# Patient Record
Sex: Male | Born: 1935 | Race: White | Hispanic: No | Smoking: Never smoker
Health system: Southern US, Community
[De-identification: ages and names within clinical notes are randomized; demographics above are authoritative.]

## PROBLEM LIST (undated history)

## (undated) DIAGNOSIS — K219 Gastro-esophageal reflux disease without esophagitis: Secondary | ICD-10-CM

## (undated) DIAGNOSIS — I442 Atrioventricular block, complete: Secondary | ICD-10-CM

## (undated) DIAGNOSIS — E139 Other specified diabetes mellitus without complications: Secondary | ICD-10-CM

## (undated) DIAGNOSIS — Z950 Presence of cardiac pacemaker: Principal | ICD-10-CM

## (undated) HISTORY — DX: Gastro-esophageal reflux disease without esophagitis: K21.9

## (undated) HISTORY — DX: Other specified diabetes mellitus without complications: E13.9

## (undated) HISTORY — DX: Atrioventricular block, complete: I44.2

## (undated) HISTORY — DX: Presence of cardiac pacemaker: Z95.0

---

## 1998-04-19 ENCOUNTER — Inpatient Hospital Stay (HOSPITAL_COMMUNITY): Admission: EM | Admit: 1998-04-19 | Discharge: 1998-04-21 | Payer: Self-pay | Admitting: Emergency Medicine

## 2005-04-04 ENCOUNTER — Ambulatory Visit: Payer: Self-pay | Admitting: Internal Medicine

## 2005-10-27 ENCOUNTER — Ambulatory Visit: Payer: Self-pay | Admitting: *Deleted

## 2005-11-10 ENCOUNTER — Ambulatory Visit: Payer: Self-pay | Admitting: Cardiology

## 2005-11-15 ENCOUNTER — Ambulatory Visit: Payer: Self-pay | Admitting: Cardiology

## 2005-11-21 ENCOUNTER — Ambulatory Visit (HOSPITAL_COMMUNITY): Admission: RE | Admit: 2005-11-21 | Discharge: 2005-11-21 | Payer: Self-pay | Admitting: Internal Medicine

## 2005-11-21 ENCOUNTER — Ambulatory Visit: Payer: Self-pay | Admitting: Cardiology

## 2005-12-28 ENCOUNTER — Ambulatory Visit: Payer: Self-pay | Admitting: Internal Medicine

## 2006-09-20 ENCOUNTER — Ambulatory Visit: Payer: Self-pay | Admitting: Cardiology

## 2007-02-12 ENCOUNTER — Ambulatory Visit: Payer: Self-pay | Admitting: Internal Medicine

## 2007-06-01 ENCOUNTER — Ambulatory Visit: Payer: Self-pay | Admitting: Internal Medicine

## 2007-11-11 ENCOUNTER — Ambulatory Visit: Payer: Self-pay | Admitting: Internal Medicine

## 2008-04-07 ENCOUNTER — Ambulatory Visit: Payer: Self-pay | Admitting: Internal Medicine

## 2008-12-23 ENCOUNTER — Encounter (INDEPENDENT_AMBULATORY_CARE_PROVIDER_SITE_OTHER): Payer: Self-pay | Admitting: *Deleted

## 2010-07-22 ENCOUNTER — Encounter (INDEPENDENT_AMBULATORY_CARE_PROVIDER_SITE_OTHER): Payer: Self-pay | Admitting: *Deleted

## 2010-09-23 ENCOUNTER — Encounter: Payer: Self-pay | Admitting: Internal Medicine

## 2011-01-03 LAB — CBC AND DIFFERENTIAL
HCT: 33 % — AB (ref 41–53)
Hemoglobin: 11.2 g/dL — AB (ref 13.5–17.5)
WBC: 6 10^3/mL

## 2011-01-03 LAB — BASIC METABOLIC PANEL
Creatinine: 1.3 mg/dL (ref 0.6–1.3)
Glucose: 68 mg/dL

## 2011-01-03 NOTE — Letter (Signed)
Summary: Device-Delinquent Check  [facility], Main Office  [address]   [place], [place] [address]   Phone: [phone]  Fax: [phone]     July 22, 2010 MRN: [id number]   Collin [name] [address] [place], [place]  [address]   Dear [name],  According to our records, you have not had your implanted device checked in the recommended period of time.  We are unable to determine appropriate device function without checking your device on a regular basis.  Please call our office to schedule an appointment , with Dr. Graciela Husbands, as soon as possible.  If you are having your device checked by another physician, please call us so that we may update our records.  Thank you,  Altha Harm, LPN  July 22, 2010 4:18 PM  [facility]  certified mail

## 2011-01-03 NOTE — Cardiovascular Report (Signed)
Summary: Certified Letter - Returned  Certified Letter - Returned   Imported By: Debby Freiberg 10/12/2010 14:30:53  _____________________________________________________________________  External Attachment:    Type:   Image     Comment:   External Document

## 2011-03-22 ENCOUNTER — Other Ambulatory Visit (HOSPITAL_COMMUNITY): Payer: Self-pay | Admitting: Internal Medicine

## 2011-03-27 ENCOUNTER — Other Ambulatory Visit (HOSPITAL_COMMUNITY): Payer: Self-pay

## 2011-03-27 ENCOUNTER — Encounter (HOSPITAL_COMMUNITY): Payer: Self-pay | Admitting: Speech Pathology

## 2011-04-13 ENCOUNTER — Ambulatory Visit (INDEPENDENT_AMBULATORY_CARE_PROVIDER_SITE_OTHER): Payer: Medicare Other | Admitting: Internal Medicine

## 2011-04-13 DIAGNOSIS — K219 Gastro-esophageal reflux disease without esophagitis: Secondary | ICD-10-CM

## 2011-04-13 DIAGNOSIS — R1310 Dysphagia, unspecified: Secondary | ICD-10-CM

## 2011-04-13 HISTORY — DX: Gastro-esophageal reflux disease without esophagitis: K21.9

## 2011-04-18 NOTE — Assessment & Plan Note (Signed)
[facility]                         ELECTROPHYSIOLOGY OFFICE NOTE   NAME:[name], Collin                       MRN:          [id number]  DATE:04/07/2008                            DOB:          10-17-1917    Mr. 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 In any case, he loves baseball and does not remember much  else.  He denies chest pain or shortness of breath.  His medication list  is incomplete at this time.   PHYSICAL EXAMINATION:  His blood pressure was 128/62 with a pulse of 85.  LUNGS:  Clear.  HEART:  Sounds were regular.  EXTREMITIES:  Without edema.   Interrogation of  his St. Jude Verity pulse generator demonstrates a P-  wave of 4 with impedance of 402, a threshold 0.75 at 0.4.  There was no  intrinsic ventricular rhythm. The impedance was 411, threshold 0.625 at  0.4.   IMPRESSION:  1. Complete heart block.  2. Status post pacer for the above.   Collin [name] is stable from a rhythm point of view and we will see him  again in three months' time.     Duke Salvia, MD, [facility]  Electronically Signed    SCK/MedQ  DD: 04/07/2008  DT: 04/07/2008  Job #: [id number]   cc:   [place] Parma

## 2011-04-20 ENCOUNTER — Other Ambulatory Visit (INDEPENDENT_AMBULATORY_CARE_PROVIDER_SITE_OTHER): Payer: Self-pay | Admitting: Internal Medicine

## 2011-04-20 ENCOUNTER — Ambulatory Visit (HOSPITAL_COMMUNITY)
Admission: RE | Admit: 2011-04-20 | Discharge: 2011-04-20 | Disposition: A | Discharge status: Home / Self Care | Payer: Medicare Other | Source: Ambulatory Visit | Attending: Internal Medicine | Admitting: Internal Medicine

## 2011-04-21 NOTE — Discharge Summary (Signed)
NAMEQUAME, SPRATLIN NO.:  [id number]   MEDICAL RECORD NO.:  [id number]          PATIENT TYPE:  OIB   LOCATION:  2899                         FACILITY:  [facility]   PHYSICIAN:  Duke Salvia, M.D.  DATE OF BIRTH:  1917-04-09   DATE OF ADMISSION:  11/21/2005  DATE OF DISCHARGE:  11/21/2005                                 DISCHARGE SUMMARY   This patient has no known drug allergies.   PRIMARY DIAGNOSES:  1.  Pacemaker for complete heart block is now at elective replacement      indicator.  2.  Patient had pacemaker generator change December 19.   SECONDARY DIAGNOSES:  1.  Complete heart block.  2.  Diabetes.  3.  History of permanent pacemaker for complete heart block.   PROCEDURE:  November 21, 2005:  Explantation of pacemaker with implantation  of St. Jude pacemaker. The pacemaker that was explanted was a Trilogy DR+.  The new device implanted was a St. Jude VERITY ADx xl dr DDR permanent  pacemaker. The patient had no post procedure complications. He is asked to  remove the bandage on the morning of Wednesday, December 20. He is to keep  his incision dry for the next 7 days, sponge bathe only until Tuesday,  December 26.   DISCHARGE MEDICATIONS:  1.  Altace 2.5 mg daily.  2.  Hydrochlorothiazide 12.5 mg daily.  3.  Amaryl 4 mg daily in the morning, 8 mg daily in the evening.  4.  Lactulose 15 mL daily.  5.  Chromagen 1 tab daily.  6.  Multivitamin daily.   The patient also has a diagnosis of diabetes. He has follow up in the  [facility] office on Thursday, December 28, 2005 at 10:30 in the  morning.   BRIEF HISTORY:  Mr. Cavanah is an 77 year old male who has a pacemaker. He  was seen in the pacemaker clinic in the [place] office. He has done very well  with this pacemaker, implanted originally for complete heart block. The  generator needs to be changed out because it is at end of life. This is to  be scheduled for November 21, 2005.   The patient is not having significant chest pain or shortness of breath. He  has been on Coumadin in the past but is not on Coumadin, at least not for  the last 6 months. The patient will present electively for pacemaker change  out December 19.   HOSPITAL COURSE:  The patient presented December 19 and he underwent  explanation of a Agricultural engineer. Jude device and implantation of a  St. Jude VERITY device without complications, discharging on his home  medications which have been listed above. Follow up in the pacer clinic,  [facility] on Thursday, December 28, 2005 at 10:30.      Maple Mirza, P.A.    ______________________________  Duke Salvia, M.D.    GM/MEDQ  D:  11/21/2005  T:  11/23/2005  Job:  [id number]   cc:   Duke Salvia, M.D.  [address]  [address]  [place]  [place] [address]   E. Graceann Congress, M.D.  [address]  [address]  [place]  [place] [address]   [place] Parma  Fax: [phone]

## 2011-04-21 NOTE — Op Note (Signed)
[name], [name] NO.:  [id number]   MEDICAL RECORD NO.:  [id number]          PATIENT TYPE:  OIB   LOCATION:  2899                         FACILITY:  [facility]   PHYSICIAN:  Collin [name], M.D.  DATE OF BIRTH:  01-04-1917   DATE OF PROCEDURE:  11/21/2005  DATE OF DISCHARGE:                                 OPERATIVE REPORT   INDICATION:  Collin [name] is an 77 year old gentleman with a previously  implanted pacemaker by Dr. Charlies Constable for complete heart block.  He now  comes in with device end of life.  He has sinus rhythm.   POSTOPERATIVE DIAGNOSIS:  Complete heart block, status post pacemaker  implantation now at end of life.   PROCEDURE:  Explantation of a previously implanted pacemaker and  implantation of a new device.   SURGEON:  Collin [name], M.D.   DESCRIPTION OF PROCEDURE:  Following the attainment of informed consent, the  patient was brought to the electrophysiology laboratory and placed on the  fluoroscopy table in supine position.  After routine prep and drape,  lidocaine was infiltrated along the line of the previous incision and  carried down to the layer of the pacemaker pocket using sharp dissection,  fluoroscopy having been used to identify the orientation of the device.  The  wound was then opened.  Care was made to make sure the leads were protected.  The device was explanted.  The atrial lead was explanted; this was a  previously implanted 1388TC, serial number [id number].  The P wave was 5.6 with  impedance of 426 and a threshold of 0.4 volts at 0.5 msec.  At this point,  the pacemaker was programmed to temporary 30 to allow for the intrinsic  rhythm to come forth.  The previously implanted Pacesetter 1388TC  ventricular lead, serial number [id number], was explanted.  The R wave was 6.6  with an impedance of 516 ohms and a threshold of 1 volt at 0.5 msec.  These  leads were then attached to a Pacesetter Verity 80X pulse generator, model  5356, serial number [id number].  P-synchronous pacing was identified.  The  pocket was copiously irrigated with antibiotic-containing saline solution,  hemostasis was assured and the leads and the pulse generator were placed in  the pocket.  The wound was closed in 3 layers in a normal fashion.  The  wound was washed and dried, and a Benzoin and Steri-Strip dressing was  applied.  Needle counts, sponge counts and instrument counts were correct at  the end of the procedure according to the staff.  The patient tolerated the  procedure without apparent complication.           ______________________________  Collin [name], M.D.     SCK/MEDQ  D:  11/21/2005  T:  11/23/2005  Job:  [id number]   cc:   [facility]  [place], [place]   [facility] Electrophysiology Laboratory   [facility]   Dr. Liliane Bade

## 2011-04-21 NOTE — Assessment & Plan Note (Signed)
[facility]                           ELECTROPHYSIOLOGY OFFICE NOTE   NAME:Ozturk, Arman                       MRN:          [id number]  DATE:09/20/2006                            DOB:          07-Apr-1917    Mr. Mccay is seen in the device clinic today, September 20, 2006, in the [place]  office for followup of his [place]. Jude Verity [id number] dual chamber pacemaker,  changed out in December 2006.  Mr. Sprong has not been seen post-op since  his initial visit in January 2007.  Upon exam the site looks good and is  healing well.  Upon interrogation, battery voltage is 2.80 volts in the  atrium, intrinsic amplitude is 3 mvolts, with an impedance of 3.85 ohms, and  threshold of 0.5 volts at 0.4 msec.  He is dependent to a rate of 30 in the  ventricle.  Impedance is 5.47 ohms with a threshold of 0.75 volts at 0.4  msec.  Evoked response is 22.3 mvolts with polarity measured at 0.39 mvolts.  Autocapture was turned on at this visit.  No other changes were made.  He  will begin tele-traces every three months in January 2008, and then be seen  in followup in one year's time in the [place] office.      ______________________________  Cleatrice Burke, RN    ______________________________  Duke Salvia, MD, [facility]    CF/MedQ  DD:  09/20/2006  DT:  09/21/2006  Job #:  [id number]

## 2011-04-27 ENCOUNTER — Ambulatory Visit (HOSPITAL_COMMUNITY)
Admission: RE | Admit: 2011-04-27 | Discharge: 2011-04-27 | Disposition: A | Discharge status: Home / Self Care | Payer: Medicare Other | Source: Ambulatory Visit | Attending: Internal Medicine | Admitting: Internal Medicine

## 2011-04-27 DIAGNOSIS — K225 Diverticulum of esophagus, acquired: Secondary | ICD-10-CM | POA: Insufficient documentation

## 2011-04-27 DIAGNOSIS — R1310 Dysphagia, unspecified: Secondary | ICD-10-CM | POA: Insufficient documentation

## 2011-04-27 DIAGNOSIS — K224 Dyskinesia of esophagus: Secondary | ICD-10-CM | POA: Insufficient documentation

## 2011-05-19 ENCOUNTER — Ambulatory Visit (HOSPITAL_COMMUNITY)
Admission: RE | Admit: 2011-05-19 | Discharge: 2011-05-19 | Disposition: A | Discharge status: Home / Self Care | Payer: Medicare Other | Source: Ambulatory Visit | Attending: Internal Medicine | Admitting: Internal Medicine

## 2011-05-19 ENCOUNTER — Encounter (HOSPITAL_BASED_OUTPATIENT_CLINIC_OR_DEPARTMENT_OTHER): Payer: Medicare Other | Admitting: Internal Medicine

## 2011-05-19 DIAGNOSIS — Z7982 Long term (current) use of aspirin: Secondary | ICD-10-CM | POA: Insufficient documentation

## 2011-05-19 DIAGNOSIS — R1310 Dysphagia, unspecified: Secondary | ICD-10-CM

## 2011-05-19 DIAGNOSIS — K222 Esophageal obstruction: Secondary | ICD-10-CM

## 2011-05-19 DIAGNOSIS — Z79899 Other long term (current) drug therapy: Secondary | ICD-10-CM | POA: Insufficient documentation

## 2011-05-19 DIAGNOSIS — I10 Essential (primary) hypertension: Secondary | ICD-10-CM | POA: Insufficient documentation

## 2011-05-19 DIAGNOSIS — E119 Type 2 diabetes mellitus without complications: Secondary | ICD-10-CM | POA: Insufficient documentation

## 2011-05-19 DIAGNOSIS — Z794 Long term (current) use of insulin: Secondary | ICD-10-CM | POA: Insufficient documentation

## 2011-05-19 HISTORY — PX: ESOPHAGOGASTRODUODENOSCOPY: SHX1529

## 2011-06-05 NOTE — Op Note (Signed)
  NAMEGARYN, Collin [name]              ACCOUNT NO.:  [id number]  MEDICAL RECORD NO.:  [id number]  LOCATION:  DAYP                          FACILITY:  [facility]  PHYSICIAN:  Lionel December, M.D.    DATE OF BIRTH:  July 15, 1917  DATE OF PROCEDURE:  05/19/2011 DATE OF DISCHARGE:                              OPERATIVE REPORT   PROCEDURE:  Esophagogastroduodenoscopy with balloon dilation of stricture at esophagogastric junction.  INDICATION:  Collin [name] is a 77 year old Caucasian male, resident of [facility] and [address] been experiencing intermittent solid food dysphagia.  He is to regurgitate the food in order to get relief.  He denies heartburn or weight loss.  Procedure risks were reviewed with the patient.  Informed consent was obtained.  MEDICATIONS FOR CONSCIOUS SEDATION:  Cetacaine spray for pharyngeal topical anesthesia, Versed 3 mg IV.  Please note that no Demerol was used.  FINDINGS:  Procedure performed in endoscopy suite.  The patient's vital signs and O2 saturations were monitored during the procedure and remained stable.  The patient was placed in left lateral recumbent position and Pentax videoscope was passed via oropharynx without any difficulty into the proximal esophagus.  The esophageal segment below UES was tortuous.  The lumen was undersize and I could not pass the regular scope.  Therefore, pediatric scope was used and I was able to pass it distally.  Esophagus:  Stricture was noted at GE junction which was at 40 cm and estimated to be about 8 mm.  There was mucosal edema and erythema right at GE junction, erosions just proximal to it.  Stomach:  It was empty and distended very well with insufflation.  Folds of proximal stomach are normal.  There was single antral erosion. Pyloric channel was patent.  Angularis, fundus, and cardia were examined by retroflexing scope and were normal.  Duodenum:  Bulbar mucosa was normal.  Scope was passed into second  part of duodenum where mucosa and folds were normal.  This endoscope was withdrawn.  The patient was placed in propped up position and I was able to pass the regular scope in order dilate the stricture.  Balloon dilator was used.  Guidewire was pushed in the gastric lumen.  Balloon dilator was positioned across the stricture and dilated initially to 15, then 16.5, and finally to 18 mm.  As the dilator was deflated and withdrawn, I was able to pass the scope across it without any resistance.  Endoscope was withdrawn.  The patient tolerated the procedure well.  FINAL DIAGNOSES: 1. 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 2. The stricture dilated to 18 mm with the balloon. 3. Single antral erosion, possibly due to aspirin.  RECOMMENDATIONS: 1. Antireflux measures. 2. Resume aspirin on May 20, 2011. 3. Pantoprazole 30 minutes before breakfast daily p.o.  The patient will return for office visit in 8 weeks.          ______________________________ Lionel December, M.D.     NR/MEDQ  D:  05/19/2011  T:  05/20/2011  Job:  [id number]  cc:   Dr. Virgina Organ  Electronically Signed by Lionel December M.D. on 06/05/2011 12:51:34 AM

## 2011-06-08 ENCOUNTER — Encounter (INDEPENDENT_AMBULATORY_CARE_PROVIDER_SITE_OTHER): Payer: Medicare Other | Admitting: Internal Medicine

## 2011-06-28 ENCOUNTER — Encounter (INDEPENDENT_AMBULATORY_CARE_PROVIDER_SITE_OTHER): Payer: Self-pay

## 2011-07-18 ENCOUNTER — Ambulatory Visit (INDEPENDENT_AMBULATORY_CARE_PROVIDER_SITE_OTHER): Payer: Medicare Other | Admitting: Internal Medicine

## 2011-07-18 DIAGNOSIS — K222 Esophageal obstruction: Secondary | ICD-10-CM

## 2011-07-18 NOTE — Progress Notes (Signed)
Chief complaint: Follow up after undergoing and EGD/ED in June of this year by Dr. Karilyn Cota for dysphagia.  HPI:  Collin [name] is a 77 yr old male here today for follow.  He underwent an EGD/ED in June of this after undergoing a Barium Pill Study which was abnormal.  Barium Pill study revealed a tiny hiatal hernia. High grade structure of the distal thoracic esophagus at the GE junction, suspect thick Schatzki ring, which obstructed a 12.5 mm diameter barium tablet. Small to moderate Zenker diverticulum. Diffuse age related impairment of esophageal motility  The EGD revealed a high grade stricture at esophagogastric junction with inflammatory changes/erosions.  The stricture was dilated to 18mm with the balloon. Single antral erosion, possibly due to aspirin. He says that he is not having any trouble swallowing now since undergoing the EGD. He says he can eat anything he wants.    He denies any chest pain.  He denies any abdominal pain.  He has a BM about every three days..   Medications  Duoneb 1 inhalation daily ASA 81mg  daily HCTZ 12.5mg  daily Lantus 27units every hs Miralax 17gm BID daily MVI one day Oxycodone 5mg  BID  Prevacid30mg  cap daily Remeron 15mg  hs Vicodin prn ROS:  See HPI   Allergies: NKA   Objective:  Vitals: Temp 99.2, B/P 100/60, Pulse 66 and regular.  Unable to obtain a weight. Patient is wheel chair bound and unable to stand.  Patient was examained from wheelchair. Alert and oriented. Skin warm and dry. Oral mucosa is moist.   Sclera anicteric, conjunctivae is pink. Thyroid not enlarged. No cervical lymphadenopathy. Lungs clear. Heart regular rate and rhythm.  Abdomen is soft. Bowel sounds are positive. No hepatomegaly. No abdominal masses felt. No tenderness. 1-2 + edema to lower extremities . Patient is alert and oriented.  Labs: HA1C: 6.98. 03/22/2011  07/04/2011 H and H 12.0 and 34.7, MCV 91, Platelet ct 144.   Assessment:  Dysphagia, which has now resolved after  undergoing and EGD/ED for a high grade stricture ata esophagogastric junction.     Plan/Recommendation:   Nursing home to send an updated list of his medications. I advised the driver of Mr. Moss that he possibly may need an ED in the future if symptoms return.  He will continue his current medications.  Prevacid 30mg  daily

## 2011-07-18 NOTE — Progress Notes (Deleted)
Chief complaint  HPI:   ROS:   Allergies:  Medications:   Family History:   Social History:  Drugs:            Etoh: Tobacco  Medical:  Surgeries:  Objective:  Vitals:  Assessment:   Plan/Recommendation:   

## 2011-07-19 ENCOUNTER — Encounter (INDEPENDENT_AMBULATORY_CARE_PROVIDER_SITE_OTHER): Payer: Self-pay | Admitting: Internal Medicine

## 2013-08-12 ENCOUNTER — Encounter: Payer: Self-pay | Admitting: *Deleted

## 2013-08-14 ENCOUNTER — Ambulatory Visit (INDEPENDENT_AMBULATORY_CARE_PROVIDER_SITE_OTHER): Payer: PRIVATE HEALTH INSURANCE | Admitting: Internal Medicine

## 2013-08-14 ENCOUNTER — Encounter: Payer: Self-pay | Admitting: Internal Medicine

## 2013-08-14 VITALS — BP 129/56 | HR 65 | Ht 72.0 in | Wt 190.0 lb

## 2013-08-14 DIAGNOSIS — I442 Atrioventricular block, complete: Secondary | ICD-10-CM

## 2013-08-14 DIAGNOSIS — Z950 Presence of cardiac pacemaker: Secondary | ICD-10-CM

## 2013-08-14 HISTORY — DX: Presence of cardiac pacemaker: Z95.0

## 2013-08-14 HISTORY — DX: Atrioventricular block, complete: I44.2

## 2013-08-14 LAB — PACEMAKER DEVICE OBSERVATION
AL AMPLITUDE: 4 mv
AL IMPEDENCE PM: 341 Ohm
ATRIAL PACING PM: 35
BATTERY VOLTAGE: 2.78 V
VENTRICULAR PACING PM: 98

## 2013-08-14 NOTE — Patient Instructions (Addendum)
Will follow up with device clinic in 3 months  Your physician recommends that you continue on your current medications as directed. Please refer to the Current Medication list given to you today.

## 2013-08-14 NOTE — Assessment & Plan Note (Signed)
Stable post pacing 

## 2013-08-14 NOTE — Progress Notes (Signed)
Patient Care Team: Colon Branch as PCP - General (Internal Medicine)   HPI  Collin [name] is a 79 y.o. male Seen to reestablish pacemaker followup. He was placed 2006 by me for sinus node dysfunction and heart block. He is wheelchair-bound. He is a blue double-strand.  Past Medical History  Diagnosis Date  . Dysphagia 04/13/2011  . GERD (gastroesophageal reflux disease) 04/13/2011  . 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 Atrioventricular block, complete 08/14/2013  . Pacemaker-St. Jude's 08/14/2013    Past Surgical History  Procedure Laterality Date  . Esophagogastroduodenoscopy  05/19/2011    Current Outpatient Prescriptions  Medication Sig Dispense Refill  . aspirin 81 MG tablet Take 81 mg by mouth daily.        . baci-polymyx-neo-hydrocort (CORTISPORIN) 1 % ointment Place 1 % into both eyes 4 (four) times daily.      . fentaNYL (DURAGESIC - DOSED MCG/HR) 25 MCG/HR patch Place 1 patch onto the skin every 3 (three) days.      . hydrochlorothiazide (MICROZIDE) 12.5 MG capsule Take 12.5 mg by mouth. By mouth qday      . insulin glargine (LANTUS) 100 UNIT/ML injection Inject 31 Units into the skin daily.       . multivitamin (THERAGRAN) per tablet Take 1 tablet by mouth daily.        . polyethylene glycol (MIRALAX / GLYCOLAX) packet Take by mouth 2 (two) times daily.       Marland Kitchen zolpidem (AMBIEN CR) 12.5 MG CR tablet Take 2.5 mg by mouth at bedtime.       Marland Kitchen HYDROcodone-acetaminophen (VICODIN) 5-500 MG per tablet Take 1 tablet by mouth as needed.        . lansoprazole (PREVACID) 30 MG capsule Take 30 mg by mouth daily.        . mirtazapine (REMERON) 15 MG tablet Take 15 mg by mouth at bedtime.        Marland Kitchen oxycodone (OXY-IR) 5 MG capsule Take 5 mg by mouth 2 (two) times daily before a meal.         No current facility-administered medications for this visit.    Not on File  Review of Systems negative except from HPI and PMH  Physical Exam BP 129/56  Pulse 65  Ht 6' (1.829  m)  Wt 190 lb (86.183 kg)  BMI 25.76 kg/m2  Well developed and elderly sitting in a wheelchair HENT normal Neck supple   Clear Device pocket well healed; without hematoma or erythema.  There is no tethering  Regular rate and rhythm, no murmurs or gallops Abd-soft with active BS No Clubbing cyanosis 1+ edema Skin-warm and dry A & Oriented  Grossly normal sensory and motor function HHOH  ECG demonstrates AV pacing  Assessment and  Plan

## 2013-08-14 NOTE — Assessment & Plan Note (Signed)
The patient's device was interrogated.  The information was reviewed. No changes were made in the programming.    

## 2013-09-02 ENCOUNTER — Encounter: Payer: Medicare Other | Admitting: Internal Medicine

## 2013-09-09 ENCOUNTER — Encounter: Payer: Self-pay | Admitting: Internal Medicine

## 2013-12-16 ENCOUNTER — Encounter: Payer: Self-pay | Admitting: Cardiology

## 2013-12-16 ENCOUNTER — Ambulatory Visit (INDEPENDENT_AMBULATORY_CARE_PROVIDER_SITE_OTHER): Payer: PRIVATE HEALTH INSURANCE | Admitting: Cardiology

## 2013-12-16 VITALS — BP 118/71 | HR 66 | Ht 72.0 in | Wt 190.0 lb

## 2013-12-16 DIAGNOSIS — I10 Essential (primary) hypertension: Secondary | ICD-10-CM

## 2013-12-16 DIAGNOSIS — I498 Other specified cardiac arrhythmias: Secondary | ICD-10-CM

## 2013-12-16 DIAGNOSIS — R001 Bradycardia, unspecified: Secondary | ICD-10-CM

## 2013-12-16 NOTE — Progress Notes (Signed)
Clinical Summary Collin [name] is a 80 y.o.male  1. Heart block - prior St Jude dual chamber pacemaker placement in 2006 for sinus node dysfunction. Last seen by Dr Graciela Husbands in EP clinic 08/2013 with normal device function - denies any lightheadedness or dizziness. Denies any chest pain or SOB. No orthopnea or PND, no LE edema  2. HTN - compliant with meds. Denies any orthostatic symptoms, though he is primarily wheel chair bound.  - reports normal blood pressure checks at his nursing home facility.    Past Medical History  Diagnosis Date  . Dysphagia 04/13/2011  . GERD (gastroesophageal reflux disease) 04/13/2011  . 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 Atrioventricular block, complete 08/14/2013  . Pacemaker-St. Jude's 08/14/2013     Not on File   Current Outpatient Prescriptions  Medication Sig Dispense Refill  . aspirin 81 MG tablet Take 81 mg by mouth daily.        . baci-polymyx-neo-hydrocort (CORTISPORIN) 1 % ointment Place 1 % into both eyes 4 (four) times daily.      . fentaNYL (DURAGESIC - DOSED MCG/HR) 25 MCG/HR patch Place 1 patch onto the skin every 3 (three) days.      . hydrochlorothiazide (MICROZIDE) 12.5 MG capsule Take 12.5 mg by mouth. By mouth qday      . HYDROcodone-acetaminophen (VICODIN) 5-500 MG per tablet Take 1 tablet by mouth as needed.        . insulin glargine (LANTUS) 100 UNIT/ML injection Inject 31 Units into the skin daily.       . lansoprazole (PREVACID) 30 MG capsule Take 30 mg by mouth daily.        . mirtazapine (REMERON) 15 MG tablet Take 15 mg by mouth at bedtime.        . multivitamin (THERAGRAN) per tablet Take 1 tablet by mouth daily.        Marland Kitchen oxycodone (OXY-IR) 5 MG capsule Take 5 mg by mouth 2 (two) times daily before a meal.        . polyethylene glycol (MIRALAX / GLYCOLAX) packet Take by mouth 2 (two) times daily.       Marland Kitchen zolpidem (AMBIEN CR) 12.5 MG CR tablet Take 2.5 mg by mouth at bedtime.        No current  facility-administered medications for this visit.     Past Surgical History  Procedure Laterality Date  . Esophagogastroduodenoscopy  05/19/2011     Not on File    Family History  Problem Relation Age of Onset  . Kidney failure    . Anemia    . Thrombocytopenia    . Dysphagia    . Diabetes type II    . Obesity    . Constipation    . Muscular dystrophy    . Hypertension    . Dysphagia       Social History Collin [name] reports that he has never smoked. He does not have any smokeless tobacco history on file. Collin [name] has no alcohol history on file.   Review of Systems CONSTITUTIONAL: No weight loss, fever, chills, weakness or fatigue.  HEENT: Eyes: No visual loss, blurred vision, double vision or yellow sclerae.No hearing loss, sneezing, congestion, runny nose or sore throat.  SKIN: No rash or itching.  CARDIOVASCULAR: per HPI RESPIRATORY: No shortness of breath, cough or sputum.  GASTROINTESTINAL: No anorexia, nausea, vomiting or diarrhea. No abdominal pain or blood.  GENITOURINARY: No burning on urination, no polyuria NEUROLOGICAL:  No headache, dizziness, syncope, paralysis, ataxia, numbness or tingling in the extremities. No change in bowel or bladder control.  LYMPHATICS: No enlarged nodes. No history of splenectomy.  PSYCHIATRIC: No history of depression or anxiety.  ENDOCRINOLOGIC: No reports of sweating, cold or heat intolerance. No polyuria or polydipsia.  Marland Kitchen.   Physical Examination  Gen: resting comfortably, no acute distress HEENT: no scleral icterus, pupils equal round and reactive, no palptable cervical adenopathy,  CV Resp: Clear to auscultation bilaterally GI: abdomen is soft, non-tender, non-distended, normal bowel sounds, no hepatosplenomegaly MSK: extremities are warm, no edema.  Skin: warm, no rash Neuro:  no focal deficits Psych: appropriate affect    Assessment and Plan  1. Bradycardia - no recent symptoms - normal pacemaker function on  last check 08/2013, continue routine evaluations in pacemaker clinic  2. HTN - at goal on low dose HCTZ. - can consider low dose ACE-I as alternative in the setting of DM and HTN, but given his age unclear if would be certain benefit.   Will request labs from Dr Virgina OrganQureshi including if he has had a recent lipid panel.   Follow up 1 year    Antoine PocheJonathan F. Brendia Dampier, M.D., F.A.C.C.

## 2013-12-16 NOTE — Patient Instructions (Signed)
Your physician recommends that you schedule a follow-up appointment in: 1 year with Dr. Branch. You should receive a letter in the mail in 10 months. If you do not receive this letter by November 2015 call our office to schedule this appointment.   Your physician recommends that you continue on your current medications as directed. Please refer to the Current Medication list given to you today.  

## 2014-02-19 ENCOUNTER — Encounter: Payer: Self-pay | Admitting: Internal Medicine

## 2014-02-19 ENCOUNTER — Ambulatory Visit (INDEPENDENT_AMBULATORY_CARE_PROVIDER_SITE_OTHER): Payer: PRIVATE HEALTH INSURANCE | Admitting: Internal Medicine

## 2014-02-19 VITALS — BP 137/64 | HR 63 | Ht 72.0 in

## 2014-02-19 DIAGNOSIS — Z950 Presence of cardiac pacemaker: Secondary | ICD-10-CM

## 2014-02-19 DIAGNOSIS — I495 Sick sinus syndrome: Secondary | ICD-10-CM

## 2014-02-19 DIAGNOSIS — I442 Atrioventricular block, complete: Secondary | ICD-10-CM

## 2014-02-19 LAB — MDC_IDC_ENUM_SESS_TYPE_INCLINIC
Brady Statistic AS VP Percent: 36 %
Date Time Interrogation Session: 20150319152547
Implantable Pulse Generator Model: 5386
Implantable Pulse Generator Serial Number: 1578513
Lead Channel Impedance Value: 309 Ohm
Lead Channel Pacing Threshold Amplitude: 0.75 V
Lead Channel Pacing Threshold Pulse Width: 0.4 ms
Lead Channel Sensing Intrinsic Amplitude: 2.5 mV
Lead Channel Setting Pacing Amplitude: 2 V
Lead Channel Setting Sensing Sensitivity: 2 mV
MDC IDC MSMT BATTERY IMPEDANCE: 2300 Ohm
MDC IDC MSMT BATTERY VOLTAGE: 2.78 V
MDC IDC MSMT LEADCHNL RA IMPEDANCE VALUE: 339 Ohm
MDC IDC MSMT LEADCHNL RA PACING THRESHOLD AMPLITUDE: 1 V
MDC IDC MSMT LEADCHNL RA PACING THRESHOLD PULSEWIDTH: 0.4 ms
MDC IDC SET LEADCHNL RV PACING PULSEWIDTH: 0.4 ms
MDC IDC STAT BRADY AP VP PERCENT: 59 %
MDC IDC STAT BRADY AP VS PERCENT: 1 % — AB
MDC IDC STAT BRADY AS VS PERCENT: 1.2 %

## 2014-02-19 NOTE — Patient Instructions (Signed)
Your physician recommends that you continue on your current medications as directed. Please refer to the Current Medication list given to you today.  Your physician wants you to follow-up in: 6 [place].  You will receive a reminder letter in the mail two months in advance. If you don't receive a letter, please call our office to schedule the follow-up appointment.  Your physician wants you to follow-up in: 1 year with Dr. Graciela HusbandsKlein.  You will receive a reminder letter in the mail two months in advance. If you don't receive a letter, please call our office to schedule the follow-up appointment.

## 2014-02-19 NOTE — Progress Notes (Signed)
Patient Care Team: Colon BranchAyyaz [name] as PCP - General (Internal Medicine)   HPI  Collin Saucieraymond R Helwig Sr. is a 80 y.o. male Seen in pacemaker followup for device implanted 2006 for sinus node dysfunction and heart block     Past Medical History  Diagnosis Date  . Dysphagia 04/13/2011  . GERD (gastroesophageal reflux disease) 04/13/2011  . 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 Atrioventricular block, complete 08/14/2013  . Pacemaker-St. Jude's 08/14/2013    Past Surgical History  Procedure Laterality Date  . Esophagogastroduodenoscopy  05/19/2011    Current Outpatient Prescriptions  Medication Sig Dispense Refill  . Amino Acids-Protein Hydrolys (FEEDING SUPPLEMENT, PRO-STAT SUGAR FREE 64,) LIQD Take 30 mLs by mouth 3 (three) times daily with meals.      Marland Kitchen. aspirin 81 MG tablet Take 81 mg by mouth daily.        . baci-polymyx-neo-hydrocort (CORTISPORIN) 1 % ointment Place 1 % into both eyes 4 (four) times daily.      . Cholecalciferol (D-3-5) 5000 UNITS capsule Take 5,000 Units by mouth daily.      . fentaNYL (DURAGESIC - DOSED MCG/HR) 25 MCG/HR patch Place 1 patch onto the skin every 3 (three) days.      Marland Kitchen. glipiZIDE (GLUCOTROL) 2.5 mg TABS tablet Take 2.5 mg by mouth daily before breakfast.      . guaiFENesin (ROBITUSSIN) 100 MG/5ML liquid Take 200 mg by mouth every 4 (four) hours as needed for cough.      . hydrochlorothiazide (MICROZIDE) 12.5 MG capsule Take 12.5 mg by mouth. By mouth qday      . insulin glargine (LANTUS) 100 UNIT/ML injection Inject 31 Units into the skin daily.       . insulin lispro (HUMALOG) 100 UNIT/ML injection Inject into the skin as directed. Per sliding scale      . ipratropium-albuterol (DUONEB) 0.5-2.5 (3) MG/3ML SOLN Take 3 mLs by nebulization every 6 (six) hours as needed.      Marland Kitchen. levothyroxine (SYNTHROID, LEVOTHROID) 50 MCG tablet Take 50 mcg by mouth daily before breakfast.      . mirtazapine (REMERON) 7.5 MG tablet Take 7.5 mg by mouth at  bedtime.      . multivitamin (THERAGRAN) per tablet Take 1 tablet by mouth daily.        Marland Kitchen. oxycodone (OXY-IR) 5 MG capsule Take 5 mg by mouth every 6 (six) hours as needed.       . polyethylene glycol (MIRALAX / GLYCOLAX) packet Take 17 g by mouth daily as needed.       . pseudoephedrine (SUDAFED) 60 MG tablet Take 60 mg by mouth every 6 (six) hours as needed for congestion.      . ranitidine (ZANTAC) 75 MG tablet Take 75 mg by mouth at bedtime.      . vitamin C (ASCORBIC ACID) 500 MG tablet Take 500 mg by mouth daily.      . Vitamin D, Ergocalciferol, (DRISDOL) 50000 UNITS CAPS capsule Take 50,000 Units by mouth every 7 (seven) days.      Marland Kitchen. zolpidem (AMBIEN) 5 MG tablet Take 2.5 mg by mouth at bedtime.       No current facility-administered medications for this visit.    No Known Allergies  Review of Systems negative except from HPI and PMH  Physical Exam BP 137/64  Pulse 63  Ht 6' (1.829 m) Well developed and nourished in wheel chair HENT normal Neck supple  Clear Regular rate  and rhythm, no murmurs or gallops Abd-soft with active BS No Clubbing cyanosis edema  Left leg in cast Skin-warm and dry A & Oriented  Grossly normal sensory and motor function     Assessment and  Plan  Complete heart block  Pacemaker St Jude  The patient's device was interrogated.  The information was reviewed. No changes were made in the programming.      Will arrange followup in [place] as he is in [place]

## 2014-02-26 ENCOUNTER — Encounter: Payer: Self-pay | Admitting: Internal Medicine

## 2014-09-11 ENCOUNTER — Encounter: Payer: Self-pay | Admitting: *Deleted

## 2014-09-22 ENCOUNTER — Telehealth: Payer: Self-pay | Admitting: Internal Medicine

## 2014-09-22 NOTE — Telephone Encounter (Signed)
Attempted to return call x2. No answer will try again later.

## 2014-09-22 NOTE — Telephone Encounter (Signed)
[name] received dated 09/11/14 to schedule an appointment.  There is a reminder in there for Dr Graciela HusbandsKlein for one year follow up for March.   Please contact Collin [name] in reference to letter she received

## 2014-09-22 NOTE — Telephone Encounter (Signed)
Scheduled pt for 10-16-14 @ 1330.

## 2014-10-04 ENCOUNTER — Emergency Department (HOSPITAL_COMMUNITY): Payer: PRIVATE HEALTH INSURANCE

## 2014-10-04 ENCOUNTER — Inpatient Hospital Stay (HOSPITAL_COMMUNITY)
Admission: EM | Admit: 2014-10-04 | Discharge: 2014-10-08 | DRG: 535 | Disposition: A | Discharge status: Skilled Nursing Facility | Payer: PRIVATE HEALTH INSURANCE | Attending: Internal Medicine | Admitting: Internal Medicine

## 2014-10-04 ENCOUNTER — Encounter (HOSPITAL_COMMUNITY): Payer: Self-pay | Admitting: Emergency Medicine

## 2014-10-04 DIAGNOSIS — S72012A Unspecified intracapsular fracture of left femur, initial encounter for closed fracture: Principal | ICD-10-CM | POA: Diagnosis present

## 2014-10-04 DIAGNOSIS — Z79899 Other long term (current) drug therapy: Secondary | ICD-10-CM

## 2014-10-04 DIAGNOSIS — N189 Chronic kidney disease, unspecified: Secondary | ICD-10-CM | POA: Diagnosis present

## 2014-10-04 DIAGNOSIS — G9340 Encephalopathy, unspecified: Secondary | ICD-10-CM | POA: Diagnosis present

## 2014-10-04 DIAGNOSIS — D62 Acute posthemorrhagic anemia: Secondary | ICD-10-CM | POA: Diagnosis present

## 2014-10-04 DIAGNOSIS — Z794 Long term (current) use of insulin: Secondary | ICD-10-CM

## 2014-10-04 DIAGNOSIS — E11649 Type 2 diabetes mellitus with hypoglycemia without coma: Secondary | ICD-10-CM | POA: Diagnosis present

## 2014-10-04 DIAGNOSIS — E871 Hypo-osmolality and hyponatremia: Secondary | ICD-10-CM | POA: Diagnosis present

## 2014-10-04 DIAGNOSIS — D696 Thrombocytopenia, unspecified: Secondary | ICD-10-CM | POA: Diagnosis present

## 2014-10-04 DIAGNOSIS — N179 Acute kidney failure, unspecified: Secondary | ICD-10-CM | POA: Diagnosis present

## 2014-10-04 DIAGNOSIS — I442 Atrioventricular block, complete: Secondary | ICD-10-CM | POA: Diagnosis present

## 2014-10-04 DIAGNOSIS — K219 Gastro-esophageal reflux disease without esophagitis: Secondary | ICD-10-CM | POA: Diagnosis present

## 2014-10-04 DIAGNOSIS — Z23 Encounter for immunization: Secondary | ICD-10-CM

## 2014-10-04 DIAGNOSIS — D509 Iron deficiency anemia, unspecified: Secondary | ICD-10-CM | POA: Diagnosis present

## 2014-10-04 DIAGNOSIS — R1310 Dysphagia, unspecified: Secondary | ICD-10-CM | POA: Diagnosis present

## 2014-10-04 DIAGNOSIS — S7292XA Unspecified fracture of left femur, initial encounter for closed fracture: Secondary | ICD-10-CM

## 2014-10-04 DIAGNOSIS — W19XXXA Unspecified fall, initial encounter: Secondary | ICD-10-CM | POA: Diagnosis present

## 2014-10-04 DIAGNOSIS — Y92129 Unspecified place in nursing home as the place of occurrence of the external cause: Secondary | ICD-10-CM

## 2014-10-04 DIAGNOSIS — Z66 Do not resuscitate: Secondary | ICD-10-CM | POA: Diagnosis present

## 2014-10-04 DIAGNOSIS — E039 Hypothyroidism, unspecified: Secondary | ICD-10-CM | POA: Diagnosis present

## 2014-10-04 DIAGNOSIS — E860 Dehydration: Secondary | ICD-10-CM | POA: Diagnosis not present

## 2014-10-04 DIAGNOSIS — Z950 Presence of cardiac pacemaker: Secondary | ICD-10-CM | POA: Diagnosis present

## 2014-10-04 LAB — COMPREHENSIVE METABOLIC PANEL
ALBUMIN: 2.8 g/dL — AB (ref 3.5–5.2)
ALT: 15 U/L (ref 0–53)
ANION GAP: 14 (ref 5–15)
AST: 15 U/L (ref 0–37)
Alkaline Phosphatase: 112 U/L (ref 39–117)
BILIRUBIN TOTAL: 0.7 mg/dL (ref 0.3–1.2)
BUN: 48 mg/dL — ABNORMAL HIGH (ref 6–23)
CHLORIDE: 98 meq/L (ref 96–112)
CO2: 21 mEq/L (ref 19–32)
CREATININE: 1.68 mg/dL — AB (ref 0.50–1.35)
Calcium: 8.4 mg/dL (ref 8.4–10.5)
GFR calc Af Amer: 38 mL/min — ABNORMAL LOW (ref >90)
GFR calc non Af Amer: 33 mL/min — ABNORMAL LOW (ref >90)
Glucose, Bld: 157 mg/dL — ABNORMAL HIGH (ref 70–99)
Potassium: 4.3 mEq/L (ref 3.7–5.3)
Sodium: 133 mEq/L — ABNORMAL LOW (ref 137–147)
Total Protein: 6.9 g/dL (ref 6.0–8.3)

## 2014-10-04 LAB — CBC WITH DIFFERENTIAL/PLATELET
BASOS ABS: 0.1 10*3/uL (ref 0.0–0.1)
BASOS PCT: 1 % (ref 0–1)
EOS ABS: 0.2 10*3/uL (ref 0.0–0.7)
Eosinophils Relative: 2 % (ref 0–5)
HCT: 30.2 % — ABNORMAL LOW (ref 39.0–52.0)
HEMOGLOBIN: 10.4 g/dL — AB (ref 13.0–17.0)
Lymphocytes Relative: 9 % — ABNORMAL LOW (ref 12–46)
Lymphs Abs: 0.8 10*3/uL (ref 0.7–4.0)
MCH: 33 pg (ref 26.0–34.0)
MCHC: 34.4 g/dL (ref 30.0–36.0)
MCV: 95.9 fL (ref 78.0–100.0)
MONO ABS: 1 10*3/uL (ref 0.1–1.0)
Monocytes Relative: 10 % (ref 3–12)
NEUTROS PCT: 78 % — AB (ref 43–77)
Neutro Abs: 7.7 10*3/uL (ref 1.7–7.7)
Platelets: 125 10*3/uL — ABNORMAL LOW (ref 150–400)
RBC: 3.15 MIL/uL — AB (ref 4.22–5.81)
RDW: 14.5 % (ref 11.5–15.5)
WBC: 9.9 10*3/uL (ref 4.0–10.5)

## 2014-10-04 LAB — PROTIME-INR
INR: 1.18 (ref 0.00–1.49)
Prothrombin Time: 15.2 seconds (ref 11.6–15.2)

## 2014-10-04 MED ORDER — SODIUM CHLORIDE 0.9 % IV BOLUS (SEPSIS)
500.0000 mL | Freq: Once | INTRAVENOUS | Status: AC
  Administered 2014-10-05: 500 mL via INTRAVENOUS

## 2014-10-04 MED ORDER — FENTANYL CITRATE 0.05 MG/ML IJ SOLN
50.0000 ug | Freq: Once | INTRAMUSCULAR | Status: AC
  Administered 2014-10-05: 50 ug via INTRAVENOUS
  Filled 2014-10-04: qty 2

## 2014-10-04 MED ORDER — SODIUM CHLORIDE 0.9 % IV SOLN
INTRAVENOUS | Status: DC
  Administered 2014-10-05: 17:00:00 via INTRAVENOUS
  Administered 2014-10-05: 75 mL/h via INTRAVENOUS
  Administered 2014-10-05 – 2014-10-06 (×2): via INTRAVENOUS

## 2014-10-04 NOTE — ED Notes (Signed)
Pt is more comfortable lying on right side. Good pedal pulses. Pt denies pain when asked.

## 2014-10-04 NOTE — ED Provider Notes (Signed)
CSN: [id number]     Arrival date & time 10/04/14  1745 History   First MD Initiated Contact with Patient 10/04/14 1748     Chief Complaint  Patient presents with  . Fall  . Hip Injury     (Consider location/radiation/quality/duration/timing/severity/associated sxs/prior Treatment) The history is provided by the EMS personnel and the nursing home. No language interpreter was used.  Collin ESHELMAN Sr. is a 80 year old male with past medical history of diabetes, dysphasia, AV block with pacemaker placement, GERD presenting to the emergency department from [facility] and rocking him regarding unwitnessed fall that occurred approximately 2 days ago. Imaging was performed at nursing home today that identified in acute femoral neck fracture of the right hip. As per EMS report, reported the patient has been having increasing confusion. No family, staff accompanies patient. ROS limited secondary to patient being a level V caveat. PCP Dr. Virgina Organ  Past Medical History  Diagnosis Date  . Dysphagia 04/13/2011  . GERD (gastroesophageal reflux disease) 04/13/2011  . 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 Atrioventricular block, complete 08/14/2013  . Pacemaker-St. Jude's 08/14/2013   Past Surgical History  Procedure Laterality Date  . Esophagogastroduodenoscopy  05/19/2011   Family History  Problem Relation Age of Onset  . Kidney failure    . Anemia    . Thrombocytopenia    . Dysphagia    . Diabetes type II    . Obesity    . Constipation    . Muscular dystrophy    . Hypertension    . Dysphagia     History  Substance Use Topics  . Smoking status: Never Smoker   . Smokeless tobacco: Never Used  . Alcohol Use: Not on file    Review of Systems  Unable to perform ROS: Dementia  Musculoskeletal: Positive for arthralgias (left leg pain ).      Allergies  Review of patient's allergies indicates no known allergies.  Home Medications   Prior to Admission medications     Medication Sig Start Date End Date Taking? Authorizing Provider  aspirin 81 MG tablet Take 81 mg by mouth daily.     Yes Historical Provider, MD  cetirizine (ZYRTEC) 10 MG tablet Take 10 mg by mouth daily. For 6 weeks for itching   Yes Historical Provider, MD  Eyelid Cleansers (OCUSOFT LID SCRUB) PADS Apply 1 each topically 2 (two) times daily.   Yes Historical Provider, MD  glipiZIDE (GLUCOTROL) 2.5 mg TABS tablet Take 2.5 mg by mouth daily before breakfast.   Yes Historical Provider, MD  hydrochlorothiazide (MICROZIDE) 12.5 MG capsule Take 12.5 mg by mouth. By mouth qday 06/04/09  Yes Historical Provider, MD  insulin glargine (LANTUS) 100 UNIT/ML injection Inject 31 Units into the skin daily.  11/13/12  Yes Historical Provider, MD  ipratropium-albuterol (DUONEB) 0.5-2.5 (3) MG/3ML SOLN Take 3 mLs by nebulization every 6 (six) hours as needed.   Yes Historical Provider, MD  levothyroxine (SYNTHROID, LEVOTHROID) 50 MCG tablet Take 50 mcg by mouth daily before breakfast.   Yes Historical Provider, MD  multivitamin (THERAGRAN) per tablet Take 1 tablet by mouth daily.     Yes Historical Provider, MD  oxycodone (OXY-IR) 5 MG capsule Take 5 mg by mouth every 6 (six) hours as needed.    Yes Historical Provider, MD  polyethylene glycol (MIRALAX / GLYCOLAX) packet Take 17 g by mouth daily as needed.    Yes Historical Provider, MD  ranitidine (ZANTAC) 75 MG tablet Take 75  mg by mouth at bedtime.   Yes Historical Provider, MD  Vitamin D, Ergocalciferol, (DRISDOL) 50000 UNITS CAPS capsule Take 50,000 Units by mouth every 7 (seven) days. For 8 weeks.   Yes Historical Provider, MD  Amino Acids-Protein Hydrolys (FEEDING SUPPLEMENT, PRO-STAT SUGAR FREE 64,) LIQD Take 30 mLs by mouth 3 (three) times daily with meals.    Historical Provider, MD  baci-polymyx-neo-hydrocort (CORTISPORIN) 1 % ointment Place 1 % into both eyes 4 (four) times daily. 06/26/13   Historical Provider, MD  Cholecalciferol (D-3-5) 5000 UNITS  capsule Take 5,000 Units by mouth daily.    Historical Provider, MD  fentaNYL (DURAGESIC - DOSED MCG/HR) 25 MCG/HR patch Place 1 patch onto the skin every 3 (three) days. 03/15/13   Historical Provider, MD  guaiFENesin (ROBITUSSIN) 100 MG/5ML liquid Take 200 mg by mouth every 4 (four) hours as needed for cough.    Historical Provider, MD  insulin lispro (HUMALOG) 100 UNIT/ML injection Inject into the skin as directed. Per sliding scale    Historical Provider, MD  mirtazapine (REMERON) 7.5 MG tablet Take 7.5 mg by mouth at bedtime.    Historical Provider, MD  pseudoephedrine (SUDAFED) 60 MG tablet Take 60 mg by mouth every 6 (six) hours as needed for congestion.    Historical Provider, MD  vitamin C (ASCORBIC ACID) 500 MG tablet Take 500 mg by mouth daily.    Historical Provider, MD  zolpidem (AMBIEN) 5 MG tablet Take 2.5 mg by mouth at bedtime.    Historical Provider, MD   BP 128/46 mmHg  Pulse 75  Temp(Src) 98.5 F (36.9 C) (Oral)  Resp 18  SpO2 97% Physical Exam  Constitutional: He appears well-developed and well-nourished. No distress.  HENT:  Head: Normocephalic and atraumatic.  Eyes: Conjunctivae and EOM are normal. Pupils are equal, round, and reactive to light. Right eye exhibits no discharge. Left eye exhibits no discharge.  Neck: Normal range of motion. Neck supple.  Cardiovascular: Normal rate, regular rhythm and normal heart sounds.  Exam reveals no friction rub.   No murmur heard. Pulses:      Radial pulses are 2+ on the right side, and 2+ on the left side.       Dorsalis pedis pulses are 2+ on the right side, and 2+ on the left side.  Cap refill < 3 seconds  Pulmonary/Chest: Effort normal and breath sounds normal. No respiratory distress. He has no wheezes. He has no rales.  Musculoskeletal:       Left hip: He exhibits decreased range of motion, tenderness and bony tenderness. He exhibits normal strength, no swelling, no crepitus and no deformity.       Legs: Patient laying  on right side. Tenderness upon palpation to left hip and left femur-patient opens eyes and groans upon palpation. Patient refuses to move left lower extremity.  Neurological: He is alert. No cranial nerve deficit. He exhibits normal muscle tone.  Skin: Skin is warm and dry. No rash noted. He is not diaphoretic. No erythema.  Psychiatric: He has a normal mood and affect. His behavior is normal. Thought content normal.  Nursing note and vitals reviewed.   ED Course  Procedures (including critical care time)  Results for orders placed or performed during the hospital encounter of 10/04/14  CBC with Differential  Result Value Ref Range   WBC 9.9 4.0 - 10.5 K/uL   RBC 3.15 (L) 4.22 - 5.81 MIL/uL   Hemoglobin 10.4 (L) 13.0 - 17.0 g/dL   HCT  30.2 (L) 39.0 - 52.0 %   MCV 95.9 78.0 - 100.0 fL   MCH 33.0 26.0 - 34.0 pg   MCHC 34.4 30.0 - 36.0 g/dL   RDW 34.7 42.5 - 95.6 %   Platelets 125 (L) 150 - 400 K/uL   Neutrophils Relative % 78 (H) 43 - 77 %   Neutro Abs 7.7 1.7 - 7.7 K/uL   Lymphocytes Relative 9 (L) 12 - 46 %   Lymphs Abs 0.8 0.7 - 4.0 K/uL   Monocytes Relative 10 3 - 12 %   Monocytes Absolute 1.0 0.1 - 1.0 K/uL   Eosinophils Relative 2 0 - 5 %   Eosinophils Absolute 0.2 0.0 - 0.7 K/uL   Basophils Relative 1 0 - 1 %   Basophils Absolute 0.1 0.0 - 0.1 K/uL  Comprehensive metabolic panel  Result Value Ref Range   Sodium 133 (L) 137 - 147 mEq/L   Potassium 4.3 3.7 - 5.3 mEq/L   Chloride 98 96 - 112 mEq/L   CO2 21 19 - 32 mEq/L   Glucose, Bld 157 (H) 70 - 99 mg/dL   BUN 48 (H) 6 - 23 mg/dL   Creatinine, Ser 3.87 (H) 0.50 - 1.35 mg/dL   Calcium 8.4 8.4 - 56.4 mg/dL   Total Protein 6.9 6.0 - 8.3 g/dL   Albumin 2.8 (L) 3.5 - 5.2 g/dL   AST 15 0 - 37 U/L   ALT 15 0 - 53 U/L   Alkaline Phosphatase 112 39 - 117 U/L   Total Bilirubin 0.7 0.3 - 1.2 mg/dL   GFR calc non Af Amer 33 (L) >90 mL/min   GFR calc Af Amer 38 (L) >90 mL/min   Anion gap 14 5 - 15  Protime-INR  Result Value  Ref Range   Prothrombin Time 15.2 11.6 - 15.2 seconds   INR 1.18 0.00 - 1.49    Labs Review Labs Reviewed  CBC WITH DIFFERENTIAL - Abnormal; Notable for the following:    RBC 3.15 (*)    Hemoglobin 10.4 (*)    HCT 30.2 (*)    Platelets 125 (*)    Neutrophils Relative % 78 (*)    Lymphocytes Relative 9 (*)    All other components within normal limits  COMPREHENSIVE METABOLIC PANEL - Abnormal; Notable for the following:    Sodium 133 (*)    Glucose, Bld 157 (*)    BUN 48 (*)    Creatinine, Ser 1.68 (*)    Albumin 2.8 (*)    GFR calc non Af Amer 33 (*)    GFR calc Af Amer 38 (*)    All other components within normal limits  PROTIME-INR  URINALYSIS, ROUTINE W REFLEX MICROSCOPIC    Imaging Review Dg Chest 1 View  10/04/2014   CLINICAL DATA:  Recent fall with chest pain  EXAM: CHEST - 1 VIEW  COMPARISON:  11/21/2005  FINDINGS: A pacing device is again seen. The cardiac shadow is within normal limits. The examination is limited secondary to the patient being unable to position himself. No focal infiltrate is seen. No definitive bony abnormality is seen.  IMPRESSION: No acute abnormality noted.   Electronically Signed   By: Alcide Clever M.D.   On: 10/04/2014 20:34   Dg Hip Bilateral W/pelvis  10/04/2014   CLINICAL DATA:  Initial encounter. Fall 2 days ago. RIGHT and LEFT hip pain.  EXAM: BILATERAL HIP WITH PELVIS - 4+ VIEW  COMPARISON:  None.  FINDINGS: RIGHT hip  dynamic screw fixation is present. No complicating features. Encircling cables are present around the [facility] and proximal femoral plate. Prominent stool is present within the rectum. Radiographs are technically suboptimal due to obliquity and contracture of the RIGHT hip.  The LEFT hip lateral views are technically suboptimal. No displaced fracture is identified. On the lateral view, the lateral margin of the cortex is difficult to visualize but does appear contiguous.  Atherosclerosis is present.  IMPRESSION: No acute osseous  abnormality. Contracture of the LEFT hip. Old ORIF of the RIGHT hip.   Electronically Signed   By: Andreas NewportGeoffrey  Lamke M.D.   On: 10/04/2014 18:55   Dg Femur Left  10/04/2014   CLINICAL DATA:  Recent fall with left femur pain  EXAM: LEFT FEMUR - 2 VIEW  COMPARISON:  None.  FINDINGS: Generalized osteopenia is noted. The known distal femoral fractures are not well appreciated on these images but better seen on the recent knee image. There is some suggestion of a subcapital neck fracture.  IMPRESSION: Suspicion for a subcapital left femoral neck fracture. CT may be helpful for further evaluation.   Electronically Signed   By: Alcide CleverMark  Lukens M.D.   On: 10/04/2014 20:37   Dg Femur Right  10/04/2014   CLINICAL DATA:  Recent fall with pain  EXAM: RIGHT FEMUR - 2 VIEW  COMPARISON:  None.  FINDINGS: Postsurgical changes are noted in the femur with fixation sideplate and multiple fixation screws. The previously seen fractures have is and healed. No new fractures are seen. Generalized osteopenia is noted.  IMPRESSION: Chronic changes without acute abnormality.   Electronically Signed   By: Alcide CleverMark  Lukens M.D.   On: 10/04/2014 20:38   Ct Head Wo Contrast  10/04/2014   CLINICAL DATA:  Confusion and pain post trauma 2 days prior  EXAM: CT HEAD WITHOUT CONTRAST  CT CERVICAL SPINE WITHOUT CONTRAST  TECHNIQUE: Multidetector CT imaging of the head and cervical spine was performed following the standard protocol without intravenous contrast. Multiplanar CT image reconstructions of the cervical spine were also generated.  COMPARISON:  None.  FINDINGS: CT HEAD FINDINGS  There is moderate diffuse atrophy. There is no intracranial mass, hemorrhage, extra-axial fluid collection, or midline shift. There is mild patchy small vessel disease in the centra semiovale bilaterally, somewhat more on the right than on the left. There is no acute appearing infarct. Bony calvarium appears intact. Mastoid air cells on the left are clear. There is  diffuse opacification of hypoplastic mastoids on the right. There is debris in each external auditory canal. There is diffuse opacification in the right middle ear region without bony destruction. There is mucosal thickening in the right maxillary antrum as well as in the ethmoid air cells bilaterally. There is extensive opacification of the right sphenoid sinus.  CT CERVICAL SPINE FINDINGS  There is marked scoliosis. Bones are osteoporotic. There is no demonstrable fracture. There is 5 mm of anterolisthesis of C3 on C4. There is no other appreciable spondylolisthesis. Prevertebral soft tissues and predental space regions are normal. There is moderate disc space narrowing at C3-4, C5-6, and C7-T1. There is multifocal osteoarthritic change. There is no frank disc extrusion or stenosis. Bones are osteoporotic. There is fibrosis in both apical regions.  IMPRESSION: CT head: Atrophy with periventricular small vessel disease. No intracranial mass, hemorrhage, or extra-axial fluid. There is diffuse opacification of hypoplastic mastoids on the right. There is multifocal paranasal sinus disease. There is apparent cerumen in each external auditory canal. There is diffuse  opacification in the right middle ear region without appreciable bony destruction.  CT cervical spine: No demonstrable fracture. Spondylolisthesis at C3-4 is felt to be due to underlying spondylosis. There is multifocal osteoarthritic change. There is scoliosis. Bones are osteoporotic.   Electronically Signed   By: Bretta Bang M.D.   On: 10/04/2014 19:13   Ct Cervical Spine Wo Contrast  10/04/2014   CLINICAL DATA:  Confusion and pain post trauma 2 days prior  EXAM: CT HEAD WITHOUT CONTRAST  CT CERVICAL SPINE WITHOUT CONTRAST  TECHNIQUE: Multidetector CT imaging of the head and cervical spine was performed following the standard protocol without intravenous contrast. Multiplanar CT image reconstructions of the cervical spine were also generated.   COMPARISON:  None.  FINDINGS: CT HEAD FINDINGS  There is moderate diffuse atrophy. There is no intracranial mass, hemorrhage, extra-axial fluid collection, or midline shift. There is mild patchy small vessel disease in the centra semiovale bilaterally, somewhat more on the right than on the left. There is no acute appearing infarct. Bony calvarium appears intact. Mastoid air cells on the left are clear. There is diffuse opacification of hypoplastic mastoids on the right. There is debris in each external auditory canal. There is diffuse opacification in the right middle ear region without bony destruction. There is mucosal thickening in the right maxillary antrum as well as in the ethmoid air cells bilaterally. There is extensive opacification of the right sphenoid sinus.  CT CERVICAL SPINE FINDINGS  There is marked scoliosis. Bones are osteoporotic. There is no demonstrable fracture. There is 5 mm of anterolisthesis of C3 on C4. There is no other appreciable spondylolisthesis. Prevertebral soft tissues and predental space regions are normal. There is moderate disc space narrowing at C3-4, C5-6, and C7-T1. There is multifocal osteoarthritic change. There is no frank disc extrusion or stenosis. Bones are osteoporotic. There is fibrosis in both apical regions.  IMPRESSION: CT head: Atrophy with periventricular small vessel disease. No intracranial mass, hemorrhage, or extra-axial fluid. There is diffuse opacification of hypoplastic mastoids on the right. There is multifocal paranasal sinus disease. There is apparent cerumen in each external auditory canal. There is diffuse opacification in the right middle ear region without appreciable bony destruction.  CT cervical spine: No demonstrable fracture. Spondylolisthesis at C3-4 is felt to be due to underlying spondylosis. There is multifocal osteoarthritic change. There is scoliosis. Bones are osteoporotic.   Electronically Signed   By: Bretta Bang M.D.   On:  10/04/2014 19:13   Dg Knee Complete 4 Views Left  10/04/2014   CLINICAL DATA:  Fall 2 days ago.  Left knee pain.  EXAM: LEFT KNEE - COMPLETE 4+ VIEW  COMPARISON:  None.  FINDINGS: There is extensive osteopenia. There are extensive vascular calcifications. There is concern for a cortical step-off involving the lateral distal femur at the junction of the diaphysis and metaphysis. Fracture is not confidently identified on the cross-table lateral views. Tibial plateau and proximal fibula appear to be intact. There does not appear to be a large joint effusion.  IMPRESSION: Concern for a mildly displaced fracture involving the distal femur as described. This finding is not confirmed on all views and could be further evaluated with cross-sectional imaging. Recommend clinical correlation in this area.   Electronically Signed   By: Richarda Overlie M.D.   On: 10/04/2014 18:57     EKG Interpretation None      MDM   Final diagnoses:  Fall    Medications - No data to display  Filed Vitals:   10/04/14 1742 10/04/14 1911  BP: 111/41 128/46  Pulse: 80 75  Temp: 98.5 F (36.9 C)   TempSrc: Oral   Resp: 20 18  SpO2: 97% 97%     CBC negative elevated white blood cell count. CMP noted elevated B1 of 48, creatinine 1.68. PT/INR within normal limits.Plain film of left knee concern for mildly displaced fracture  Involving the distal femur-recommended CT. Plain film of bilateral hip with pelvis no acute osseous injury noted-contracture of the left hip, old or risks of the right hip.chest x-ray noted chronic changes without acute abnormality. Right femur plain film chronic changes without acute abnormality. Left femur suspicion for subcapital left femoral neck fracture-CT recommended.CT head no intracranial mass, hemorrhage or extra-axial fluid noted-unremarkable for acute abnormalities. CT cervical spine negative for acute osseous injury-osteophytic changes noted. CT of left femur pending. UA pending. Suspicion of  altered mental due to acute fracture and possible UA. Patient will most likely need admission. Care assumed to Dr. Genene ChurnM. James.   Raymon MuttonMarissa Elzena Muston, PA-C 10/05/14 2354  Rolland PorterMark James, MD 10/09/14 [phone]

## 2014-10-04 NOTE — ED Notes (Signed)
[facility]o unwittnessed fall 2 days ago. Pt reports right hip pain and left knee pain. The facility ordered portable xrays and right was reported fractured. Left knee xray not resulted yet. Pt is usually confused but facility reports that patient seems more confused than normal.

## 2014-10-04 NOTE — ED Provider Notes (Signed)
Medical screening examination/treatment/procedure(s) were conducted as a shared visit with non-physician practitioner(s) and myself.  I personally evaluated the patient during the encounter.   EKG Interpretation None      89yM with LLE pain after fall. Imaging significant for nondisplced L femoral neck fracture as well as  distal femoral metaphysis. Closed injury. Vascularly intact distally. Pt groggy and not very cooperative with my exam. Not sure of his exact baseline. Did move LLE spontaneously when trying to lay back on his R side. Labs with mild hyponatremia, elevated Cr with BUN:Cr of ~30:1 consistent with dehydration. IVF. Will discuss with ortho. Medicine admission.  Discussed with Dr Sherlean Foot. Will see in consultation.    Dg Chest 1 View  10/04/2014   CLINICAL DATA:  Recent fall with chest pain  EXAM: CHEST - 1 VIEW  COMPARISON:  11/21/2005  FINDINGS: A pacing device is again seen. The cardiac shadow is within normal limits. The examination is limited secondary to the patient being unable to position himself. No focal infiltrate is seen. No definitive bony abnormality is seen.  IMPRESSION: No acute abnormality noted.   Electronically Signed   By: Alcide Clever M.D.   On: 10/04/2014 20:34   Dg Hip Bilateral W/pelvis  10/04/2014   CLINICAL DATA:  Initial encounter. Fall 2 days ago. RIGHT and LEFT hip pain.  EXAM: BILATERAL HIP WITH PELVIS - 4+ VIEW  COMPARISON:  None.  FINDINGS: RIGHT hip dynamic screw fixation is present. No complicating features. Encircling cables are present around the [facility] and proximal femoral plate. Prominent stool is present within the rectum. Radiographs are technically suboptimal due to obliquity and contracture of the RIGHT hip.  The LEFT hip lateral views are technically suboptimal. No displaced fracture is identified. On the lateral view, the lateral margin of the cortex is difficult to visualize but does appear contiguous.  Atherosclerosis is present.  IMPRESSION: No  acute osseous abnormality. Contracture of the LEFT hip. Old ORIF of the RIGHT hip.   Electronically Signed   By: Andreas Newport M.D.   On: 10/04/2014 18:55   Dg Femur Left  10/04/2014   CLINICAL DATA:  Recent fall with left femur pain  EXAM: LEFT FEMUR - 2 VIEW  COMPARISON:  None.  FINDINGS: Generalized osteopenia is noted. The known distal femoral fractures are not well appreciated on these images but better seen on the recent knee image. There is some suggestion of a subcapital neck fracture.  IMPRESSION: Suspicion for a subcapital left femoral neck fracture. CT may be helpful for further evaluation.   Electronically Signed   By: Alcide Clever M.D.   On: 10/04/2014 20:37   Dg Femur Right  10/04/2014   CLINICAL DATA:  Recent fall with pain  EXAM: RIGHT FEMUR - 2 VIEW  COMPARISON:  None.  FINDINGS: Postsurgical changes are noted in the femur with fixation sideplate and multiple fixation screws. The previously seen fractures have is and healed. No new fractures are seen. Generalized osteopenia is noted.  IMPRESSION: Chronic changes without acute abnormality.   Electronically Signed   By: Alcide Clever M.D.   On: 10/04/2014 20:38   Ct Head Wo Contrast  10/04/2014   CLINICAL DATA:  Confusion and pain post trauma 2 days prior  EXAM: CT HEAD WITHOUT CONTRAST  CT CERVICAL SPINE WITHOUT CONTRAST  TECHNIQUE: Multidetector CT imaging of the head and cervical spine was performed following the standard protocol without intravenous contrast. Multiplanar CT image reconstructions of the cervical spine were also generated.  COMPARISON:  None.  FINDINGS: CT HEAD FINDINGS  There is moderate diffuse atrophy. There is no intracranial mass, hemorrhage, extra-axial fluid collection, or midline shift. There is mild patchy small vessel disease in the centra semiovale bilaterally, somewhat more on the right than on the left. There is no acute appearing infarct. Bony calvarium appears intact. Mastoid air cells on the left are clear.  There is diffuse opacification of hypoplastic mastoids on the right. There is debris in each external auditory canal. There is diffuse opacification in the right middle ear region without bony destruction. There is mucosal thickening in the right maxillary antrum as well as in the ethmoid air cells bilaterally. There is extensive opacification of the right sphenoid sinus.  CT CERVICAL SPINE FINDINGS  There is marked scoliosis. Bones are osteoporotic. There is no demonstrable fracture. There is 5 mm of anterolisthesis of C3 on C4. There is no other appreciable spondylolisthesis. Prevertebral soft tissues and predental space regions are normal. There is moderate disc space narrowing at C3-4, C5-6, and C7-T1. There is multifocal osteoarthritic change. There is no frank disc extrusion or stenosis. Bones are osteoporotic. There is fibrosis in both apical regions.  IMPRESSION: CT head: Atrophy with periventricular small vessel disease. No intracranial mass, hemorrhage, or extra-axial fluid. There is diffuse opacification of hypoplastic mastoids on the right. There is multifocal paranasal sinus disease. There is apparent cerumen in each external auditory canal. There is diffuse opacification in the right middle ear region without appreciable bony destruction.  CT cervical spine: No demonstrable fracture. Spondylolisthesis at C3-4 is felt to be due to underlying spondylosis. There is multifocal osteoarthritic change. There is scoliosis. Bones are osteoporotic.   Electronically Signed   By: Bretta BangWilliam  Woodruff M.D.   On: 10/04/2014 19:13   Ct Cervical Spine Wo Contrast  10/04/2014   CLINICAL DATA:  Confusion and pain post trauma 2 days prior  EXAM: CT HEAD WITHOUT CONTRAST  CT CERVICAL SPINE WITHOUT CONTRAST  TECHNIQUE: Multidetector CT imaging of the head and cervical spine was performed following the standard protocol without intravenous contrast. Multiplanar CT image reconstructions of the cervical spine were also  generated.  COMPARISON:  None.  FINDINGS: CT HEAD FINDINGS  There is moderate diffuse atrophy. There is no intracranial mass, hemorrhage, extra-axial fluid collection, or midline shift. There is mild patchy small vessel disease in the centra semiovale bilaterally, somewhat more on the right than on the left. There is no acute appearing infarct. Bony calvarium appears intact. Mastoid air cells on the left are clear. There is diffuse opacification of hypoplastic mastoids on the right. There is debris in each external auditory canal. There is diffuse opacification in the right middle ear region without bony destruction. There is mucosal thickening in the right maxillary antrum as well as in the ethmoid air cells bilaterally. There is extensive opacification of the right sphenoid sinus.  CT CERVICAL SPINE FINDINGS  There is marked scoliosis. Bones are osteoporotic. There is no demonstrable fracture. There is 5 mm of anterolisthesis of C3 on C4. There is no other appreciable spondylolisthesis. Prevertebral soft tissues and predental space regions are normal. There is moderate disc space narrowing at C3-4, C5-6, and C7-T1. There is multifocal osteoarthritic change. There is no frank disc extrusion or stenosis. Bones are osteoporotic. There is fibrosis in both apical regions.  IMPRESSION: CT head: Atrophy with periventricular small vessel disease. No intracranial mass, hemorrhage, or extra-axial fluid. There is diffuse opacification of hypoplastic mastoids on the right. There is multifocal paranasal  sinus disease. There is apparent cerumen in each external auditory canal. There is diffuse opacification in the right middle ear region without appreciable bony destruction.  CT cervical spine: No demonstrable fracture. Spondylolisthesis at C3-4 is felt to be due to underlying spondylosis. There is multifocal osteoarthritic change. There is scoliosis. Bones are osteoporotic.   Electronically Signed   By: Bretta BangWilliam  Woodruff M.D.    On: 10/04/2014 19:13   Ct Femur Left Wo Contrast  10/04/2014   CLINICAL DATA:  Evaluation of fracture seen on previous plain x-ray. Previous plain films demonstrated suspicion of subcapital left femoral neck fracture.  EXAM: CT OF THE LEFT FEMUR WITHOUT CONTRAST  TECHNIQUE: Multidetector CT imaging was performed according to the standard protocol. Multiplanar CT image reconstructions were also generated.  COMPARISON:  Plain radiographs 10/04/2014  FINDINGS: Examination is technically limited due to difficulty with patient positioning, motion and uncooperative patient.  Diffuse bone demineralization. There is sclerosis in the left femoral neck with cortical irregularity consistent with nondisplaced fracture. No dislocation of the hip. Linear lucency with cortical step-off in the distal left femoral metaphysis consistent with nondisplaced fracture here is well. Visualized pelvis appears intact. Postoperative changes with fixation hardware in the right hip.  IMPRESSION: Nondisplaced fractures demonstrated in the left femoral neck and in the distal left femoral metaphysis.   Electronically Signed   By: Burman NievesWilliam  Stevens M.D.   On: 10/04/2014 23:23   Dg Knee Complete 4 Views Left  10/04/2014   CLINICAL DATA:  Fall 2 days ago.  Left knee pain.  EXAM: LEFT KNEE - COMPLETE 4+ VIEW  COMPARISON:  None.  FINDINGS: There is extensive osteopenia. There are extensive vascular calcifications. There is concern for a cortical step-off involving the lateral distal femur at the junction of the diaphysis and metaphysis. Fracture is not confidently identified on the cross-table lateral views. Tibial plateau and proximal fibula appear to be intact. There does not appear to be a large joint effusion.  IMPRESSION: Concern for a mildly displaced fracture involving the distal femur as described. This finding is not confirmed on all views and could be further evaluated with cross-sectional imaging. Recommend clinical correlation in this  area.   Electronically Signed   By: Richarda OverlieAdam  Henn M.D.   On: 10/04/2014 18:57   Raeford RazorStephen Kamauri Denardo, MD 10/05/14 [phone]

## 2014-10-04 NOTE — ED Notes (Signed)
Pt in XRAY 

## 2014-10-04 NOTE — ED Notes (Signed)
PT returned from xray and CT

## 2014-10-04 NOTE — ED Notes (Signed)
Bed: [id number] Expected date: 10/04/14 Expected time: 5:39 PM Means of arrival: Ambulance Comments: Fall

## 2014-10-05 ENCOUNTER — Encounter (HOSPITAL_COMMUNITY): Payer: Self-pay

## 2014-10-05 DIAGNOSIS — E039 Hypothyroidism, unspecified: Secondary | ICD-10-CM | POA: Diagnosis not present

## 2014-10-05 DIAGNOSIS — D62 Acute posthemorrhagic anemia: Secondary | ICD-10-CM

## 2014-10-05 DIAGNOSIS — N179 Acute kidney failure, unspecified: Secondary | ICD-10-CM | POA: Diagnosis present

## 2014-10-05 DIAGNOSIS — D696 Thrombocytopenia, unspecified: Secondary | ICD-10-CM | POA: Diagnosis present

## 2014-10-05 DIAGNOSIS — I442 Atrioventricular block, complete: Secondary | ICD-10-CM | POA: Diagnosis not present

## 2014-10-05 DIAGNOSIS — Z794 Long term (current) use of insulin: Secondary | ICD-10-CM | POA: Diagnosis not present

## 2014-10-05 DIAGNOSIS — N189 Chronic kidney disease, unspecified: Secondary | ICD-10-CM | POA: Diagnosis not present

## 2014-10-05 DIAGNOSIS — G9340 Encephalopathy, unspecified: Secondary | ICD-10-CM | POA: Diagnosis not present

## 2014-10-05 DIAGNOSIS — S72012A Unspecified intracapsular fracture of left femur, initial encounter for closed fracture: Secondary | ICD-10-CM | POA: Diagnosis present

## 2014-10-05 DIAGNOSIS — E871 Hypo-osmolality and hyponatremia: Secondary | ICD-10-CM | POA: Diagnosis present

## 2014-10-05 DIAGNOSIS — E11649 Type 2 diabetes mellitus with hypoglycemia without coma: Secondary | ICD-10-CM | POA: Diagnosis not present

## 2014-10-05 DIAGNOSIS — K219 Gastro-esophageal reflux disease without esophagitis: Secondary | ICD-10-CM | POA: Diagnosis not present

## 2014-10-05 DIAGNOSIS — Y92129 Unspecified place in nursing home as the place of occurrence of the external cause: Secondary | ICD-10-CM | POA: Diagnosis not present

## 2014-10-05 DIAGNOSIS — Z79899 Other long term (current) drug therapy: Secondary | ICD-10-CM | POA: Diagnosis not present

## 2014-10-05 DIAGNOSIS — S7292XC Unspecified fracture of left femur, initial encounter for open fracture type IIIA, IIIB, or IIIC: Secondary | ICD-10-CM

## 2014-10-05 DIAGNOSIS — R1310 Dysphagia, unspecified: Secondary | ICD-10-CM | POA: Diagnosis not present

## 2014-10-05 DIAGNOSIS — Z66 Do not resuscitate: Secondary | ICD-10-CM | POA: Diagnosis not present

## 2014-10-05 DIAGNOSIS — Z950 Presence of cardiac pacemaker: Secondary | ICD-10-CM | POA: Diagnosis not present

## 2014-10-05 DIAGNOSIS — D509 Iron deficiency anemia, unspecified: Secondary | ICD-10-CM | POA: Diagnosis not present

## 2014-10-05 DIAGNOSIS — W19XXXA Unspecified fall, initial encounter: Secondary | ICD-10-CM | POA: Diagnosis not present

## 2014-10-05 DIAGNOSIS — Z23 Encounter for immunization: Secondary | ICD-10-CM | POA: Diagnosis not present

## 2014-10-05 DIAGNOSIS — E860 Dehydration: Secondary | ICD-10-CM | POA: Diagnosis present

## 2014-10-05 DIAGNOSIS — S7292XA Unspecified fracture of left femur, initial encounter for closed fracture: Secondary | ICD-10-CM | POA: Diagnosis present

## 2014-10-05 LAB — BASIC METABOLIC PANEL
Anion gap: 14 (ref 5–15)
BUN: 49 mg/dL — AB (ref 6–23)
CALCIUM: 8.4 mg/dL (ref 8.4–10.5)
CO2: 22 meq/L (ref 19–32)
CREATININE: 1.7 mg/dL — AB (ref 0.50–1.35)
Chloride: 101 mEq/L (ref 96–112)
GFR calc Af Amer: 38 mL/min — ABNORMAL LOW (ref >90)
GFR calc non Af Amer: 32 mL/min — ABNORMAL LOW (ref >90)
GLUCOSE: 165 mg/dL — AB (ref 70–99)
Potassium: 4.1 mEq/L (ref 3.7–5.3)
Sodium: 137 mEq/L (ref 137–147)

## 2014-10-05 LAB — CBC
HCT: 28.1 % — ABNORMAL LOW (ref 39.0–52.0)
HEMOGLOBIN: 9.5 g/dL — AB (ref 13.0–17.0)
MCH: 32.3 pg (ref 26.0–34.0)
MCHC: 33.8 g/dL (ref 30.0–36.0)
MCV: 95.6 fL (ref 78.0–100.0)
Platelets: 115 10*3/uL — ABNORMAL LOW (ref 150–400)
RBC: 2.94 MIL/uL — AB (ref 4.22–5.81)
RDW: 14.5 % (ref 11.5–15.5)
WBC: 8.5 10*3/uL (ref 4.0–10.5)

## 2014-10-05 LAB — GLUCOSE, CAPILLARY
GLUCOSE-CAPILLARY: 144 mg/dL — AB (ref 70–99)
GLUCOSE-CAPILLARY: 150 mg/dL — AB (ref 70–99)
Glucose-Capillary: 147 mg/dL — ABNORMAL HIGH (ref 70–99)
Glucose-Capillary: 168 mg/dL — ABNORMAL HIGH (ref 70–99)

## 2014-10-05 LAB — URINE MICROSCOPIC-ADD ON

## 2014-10-05 LAB — URINALYSIS, ROUTINE W REFLEX MICROSCOPIC
Bilirubin Urine: NEGATIVE
GLUCOSE, UA: NEGATIVE mg/dL
Hgb urine dipstick: NEGATIVE
KETONES UR: NEGATIVE mg/dL
Nitrite: POSITIVE — AB
Protein, ur: NEGATIVE mg/dL
Specific Gravity, Urine: 1.018 (ref 1.005–1.030)
Urobilinogen, UA: 1 mg/dL (ref 0.0–1.0)
pH: 6.5 (ref 5.0–8.0)

## 2014-10-05 LAB — MRSA PCR SCREENING: MRSA by PCR: NEGATIVE

## 2014-10-05 MED ORDER — INSULIN GLARGINE 100 UNIT/ML ~~LOC~~ SOLN
31.0000 [IU] | Freq: Every day | SUBCUTANEOUS | Status: DC
  Administered 2014-10-05 – 2014-10-06 (×2): 31 [IU] via SUBCUTANEOUS
  Filled 2014-10-05 (×2): qty 0.31

## 2014-10-05 MED ORDER — INFLUENZA VAC SPLIT QUAD 0.5 ML IM SUSY
0.5000 mL | PREFILLED_SYRINGE | INTRAMUSCULAR | Status: AC
  Administered 2014-10-06: 0.5 mL via INTRAMUSCULAR
  Filled 2014-10-05 (×2): qty 0.5

## 2014-10-05 MED ORDER — FENTANYL 25 MCG/HR TD PT72
25.0000 ug | MEDICATED_PATCH | TRANSDERMAL | Status: DC
  Administered 2014-10-05: 25 ug via TRANSDERMAL
  Filled 2014-10-05: qty 1

## 2014-10-05 MED ORDER — CETYLPYRIDINIUM CHLORIDE 0.05 % MT LIQD
7.0000 mL | Freq: Two times a day (BID) | OROMUCOSAL | Status: DC
  Administered 2014-10-05 – 2014-10-08 (×8): 7 mL via OROMUCOSAL

## 2014-10-05 MED ORDER — FENTANYL CITRATE 0.05 MG/ML IJ SOLN: 25.0000 ug | INTRAMUSCULAR | Status: DC | PRN

## 2014-10-05 MED ORDER — ASPIRIN EC 81 MG PO TBEC
81.0000 mg | DELAYED_RELEASE_TABLET | Freq: Every day | ORAL | Status: DC
  Administered 2014-10-05 – 2014-10-08 (×4): 81 mg via ORAL
  Filled 2014-10-05 (×4): qty 1

## 2014-10-05 MED ORDER — ZOLPIDEM TARTRATE 5 MG PO TABS: 2.5000 mg | ORAL_TABLET | Freq: Every day | ORAL | Status: DC

## 2014-10-05 MED ORDER — ENOXAPARIN SODIUM 30 MG/0.3ML ~~LOC~~ SOLN
30.0000 mg | SUBCUTANEOUS | Status: DC
  Administered 2014-10-05 – 2014-10-08 (×4): 30 mg via SUBCUTANEOUS
  Filled 2014-10-05 (×4): qty 0.3

## 2014-10-05 MED ORDER — LEVOTHYROXINE SODIUM 50 MCG PO TABS
50.0000 ug | ORAL_TABLET | Freq: Every day | ORAL | Status: DC
  Administered 2014-10-05 – 2014-10-08 (×4): 50 ug via ORAL
  Filled 2014-10-05 (×6): qty 1

## 2014-10-05 MED ORDER — INSULIN ASPART 100 UNIT/ML ~~LOC~~ SOLN
0.0000 [IU] | SUBCUTANEOUS | Status: DC
  Administered 2014-10-05: 3 [IU] via SUBCUTANEOUS
  Administered 2014-10-05 (×2): 2 [IU] via SUBCUTANEOUS
  Administered 2014-10-07 (×2): 3 [IU] via SUBCUTANEOUS
  Administered 2014-10-08 (×2): 2 [IU] via SUBCUTANEOUS

## 2014-10-05 MED ORDER — PNEUMOCOCCAL VAC POLYVALENT 25 MCG/0.5ML IJ INJ
0.5000 mL | INJECTION | INTRAMUSCULAR | Status: AC
  Administered 2014-10-06: 0.5 mL via INTRAMUSCULAR
  Filled 2014-10-05 (×2): qty 0.5

## 2014-10-05 NOTE — Progress Notes (Signed)
Pt arrived from ED, transported to 4th floor bed with 4 staff members. Pt alert only to self, laying on right side. VSS at this time. Pt's heels elevated off of bed and pillow in between knees. Pt grimaces when being moved in bed, otherwise appears he is sleeping. Will continue to monitor pt closely. [name], Terilyn Sano I

## 2014-10-05 NOTE — Consult Note (Signed)
Reason for Consult: Fall, left lower extremity injury Referring Physician:  Elisabeth Pigeon, MD  Collin Saucier Sr. is an 80 y.o. male.  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 Jude's (08/14/2013). Today was brought to the ED from skilled nursing facility after patient had a fall.patient is a poor historian and unable to provide any history. In the ED the imaging studies revealed nondisplaced left femoral neck fracture as well as distal femoral metaphysis fracture. No other history is obtainable at this time. Called the son, and discussed with him. As per son patient usually able to talk, feed himself.  Patient has a history of complete heart block and has pacemaker in place.   Review of systems not obtainable at this time due to patient's somnolence  Patient seen and examined this am. I would agree that history gathering is tough.  I too spoke with his son to gather baseline information Wheel chair based activity Apparently he enjoys his care at [facility]  Per son.  His son cares for his own wife stricken with Parkinson's Disease making this a challenge for him as well  Past Medical History  Diagnosis Date  . Dysphagia 04/13/2011  . GERD (gastroesophageal reflux disease) 04/13/2011  . 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 Atrioventricular block, complete 08/14/2013  . Pacemaker-St. Jude's 08/14/2013    Past Surgical History  Procedure Laterality Date  . Esophagogastroduodenoscopy  05/19/2011    Family History  Problem Relation Age of Onset  . Kidney failure    . Anemia    . Thrombocytopenia    . Dysphagia    . Diabetes type II    . Obesity    . Constipation    . Muscular dystrophy    . Hypertension    . Dysphagia      Social History:  reports that he has never smoked.  He has never used smokeless tobacco. He reports that he does not drink alcohol or use illicit drugs.  Allergies: No Known Allergies  Medications:  I have reviewed the patient's current medications. Scheduled: . antiseptic oral rinse  7 mL Mouth Rinse BID  . aspirin EC  81 mg Oral Daily  . enoxaparin (LOVENOX) injection  30 mg Subcutaneous Q24H  . fentaNYL  25 mcg Transdermal Q72H  . [START ON 10/06/2014] Influenza vac split quadrivalent PF  0.5 mL Intramuscular Tomorrow-1000  . insulin aspart  0-15 Units Subcutaneous 6 times per day  . insulin glargine  31 Units Subcutaneous Daily  . levothyroxine  50 mcg Oral QAC breakfast  . [START ON 10/06/2014] pneumococcal 23 valent vaccine  0.5 mL Intramuscular Tomorrow-1000  . zolpidem  2.5 mg Oral QHS    Results for orders placed or performed during the hospital encounter of 10/04/14 (from the past 24 hour(s))  CBC with Differential     Status: Abnormal   Collection Time: 10/04/14  7:00 PM  Result Value Ref Range   WBC 9.9 4.0 - 10.5 K/uL   RBC 3.15 (L) 4.22 - 5.81 MIL/uL   Hemoglobin 10.4 (L) 13.0 - 17.0 g/dL   HCT 16.1 (L) 09.6 - 04.5 %   MCV 95.9 78.0 - 100.0 fL   MCH 33.0 26.0 - 34.0 pg   MCHC 34.4 30.0 - 36.0 g/dL   RDW 40.9 81.1 - 91.4 %   Platelets 125 (L) 150 -  400 K/uL   Neutrophils Relative % 78 (H) 43 - 77 %   Neutro Abs 7.7 1.7 - 7.7 K/uL   Lymphocytes Relative 9 (L) 12 - 46 %   Lymphs Abs 0.8 0.7 - 4.0 K/uL   Monocytes Relative 10 3 - 12 %   Monocytes Absolute 1.0 0.1 - 1.0 K/uL   Eosinophils Relative 2 0 - 5 %   Eosinophils Absolute 0.2 0.0 - 0.7 K/uL   Basophils Relative 1 0 - 1 %   Basophils Absolute 0.1 0.0 - 0.1 K/uL  Comprehensive metabolic panel     Status: Abnormal   Collection Time: 10/04/14  7:00 PM  Result Value Ref Range   Sodium 133 (L) 137 - 147 mEq/L   Potassium 4.3 3.7 - 5.3 mEq/L   Chloride 98 96 - 112 mEq/L   CO2 21 19 - 32 mEq/L   Glucose, Bld 157 (H) 70 - 99 mg/dL   BUN 48 (H) 6 - 23 mg/dL    Creatinine, Ser 6.571.68 (H) 0.50 - 1.35 mg/dL   Calcium 8.4 8.4 - 84.610.5 mg/dL   Total Protein 6.9 6.0 - 8.3 g/dL   Albumin 2.8 (L) 3.5 - 5.2 g/dL   AST 15 0 - 37 U/L   ALT 15 0 - 53 U/L   Alkaline Phosphatase 112 39 - 117 U/L   Total Bilirubin 0.7 0.3 - 1.2 mg/dL   GFR calc non Af Amer 33 (L) >90 mL/min   GFR calc Af Amer 38 (L) >90 mL/min   Anion gap 14 5 - 15  Protime-INR     Status: None   Collection Time: 10/04/14  7:00 PM  Result Value Ref Range   Prothrombin Time 15.2 11.6 - 15.2 seconds   INR 1.18 0.00 - 1.49  Urinalysis, Routine w reflex microscopic     Status: Abnormal   Collection Time: 10/05/14  2:10 AM  Result Value Ref Range   Color, Urine YELLOW YELLOW   APPearance CLOUDY (A) CLEAR   Specific Gravity, Urine 1.018 1.005 - 1.030   pH 6.5 5.0 - 8.0   Glucose, UA NEGATIVE NEGATIVE mg/dL   Hgb urine dipstick NEGATIVE NEGATIVE   Bilirubin Urine NEGATIVE NEGATIVE   Ketones, ur NEGATIVE NEGATIVE mg/dL   Protein, ur NEGATIVE NEGATIVE mg/dL   Urobilinogen, UA 1.0 0.0 - 1.0 mg/dL   Nitrite POSITIVE (A) NEGATIVE   Leukocytes, UA MODERATE (A) NEGATIVE  Urine microscopic-add on     Status: Abnormal   Collection Time: 10/05/14  2:10 AM  Result Value Ref Range   Squamous Epithelial / LPF RARE RARE   WBC, UA 11-20 <3 WBC/hpf   Bacteria, UA FEW (A) RARE  MRSA PCR Screening     Status: None   Collection Time: 10/05/14  2:12 AM  Result Value Ref Range   MRSA by PCR NEGATIVE NEGATIVE  CBC     Status: Abnormal   Collection Time: 10/05/14  4:00 AM  Result Value Ref Range   WBC 8.5 4.0 - 10.5 K/uL   RBC 2.94 (L) 4.22 - 5.81 MIL/uL   Hemoglobin 9.5 (L) 13.0 - 17.0 g/dL   HCT 96.228.1 (L) 95.239.0 - 84.152.0 %   MCV 95.6 78.0 - 100.0 fL   MCH 32.3 26.0 - 34.0 pg   MCHC 33.8 30.0 - 36.0 g/dL   RDW 32.414.5 40.111.5 - 02.715.5 %   Platelets 115 (L) 150 - 400 K/uL  Basic metabolic panel     Status: Abnormal  Collection Time: 10/05/14  4:00 AM  Result Value Ref Range   Sodium 137 137 - 147 mEq/L    Potassium 4.1 3.7 - 5.3 mEq/L   Chloride 101 96 - 112 mEq/L   CO2 22 19 - 32 mEq/L   Glucose, Bld 165 (H) 70 - 99 mg/dL   BUN 49 (H) 6 - 23 mg/dL   Creatinine, Ser 1.611.70 (H) 0.50 - 1.35 mg/dL   Calcium 8.4 8.4 - 09.610.5 mg/dL   GFR calc non Af Amer 32 (L) >90 mL/min   GFR calc Af Amer 38 (L) >90 mL/min   Anion gap 14 5 - 15  Glucose, capillary     Status: Abnormal   Collection Time: 10/05/14 10:44 AM  Result Value Ref Range   Glucose-Capillary 144 (H) 70 - 99 mg/dL    X-ray: CLINICAL DATA: Recent fall with pain  EXAM: RIGHT FEMUR - 2 VIEW  COMPARISON: None.  FINDINGS: Postsurgical changes are noted in the femur with fixation sideplate and multiple fixation screws. The previously seen fractures have is and healed. No new fractures are seen. Generalized osteopenia is noted.  IMPRESSION: Chronic changes without acute abnormality.  Electronically Signed  By: Alcide CleverMark Lukens M.D.  EXAM: BILATERAL HIP WITH PELVIS - 4+ VIEW  COMPARISON: None.  FINDINGS: RIGHT hip dynamic screw fixation is present. No complicating features. Encircling [facility] and proximal [facility] plate. Prominent stool is present within the rectum. Radiographs are technically suboptimal due to obliquity and contracture of the RIGHT hip.  The LEFT hip lateral views are technically suboptimal. No displaced fracture is identified. On the lateral view, the lateral margin of the cortex is difficult to visualize but does appear contiguous.  Atherosclerosis is present.  IMPRESSION: No acute osseous abnormality. Contracture of the LEFT hip. Old ORIF of the RIGHT hip.  CLINICAL DATA: Fall 2 days ago. Left knee pain.  EXAM: LEFT KNEE - COMPLETE 4+ VIEW  COMPARISON: None.  FINDINGS: There is extensive osteopenia. There are extensive vascular calcifications. There is concern for a cortical step-off involving the lateral distal femur at the junction of the  diaphysis and metaphysis. Fracture is not confidently identified on the cross-table lateral views. Tibial plateau and proximal fibula appear to be intact. There does not appear to be a large joint effusion.  IMPRESSION: Concern for a mildly displaced fracture involving the distal femur as described. This finding is not confirmed on all views and could be further evaluated with cross-sectional imaging. Recommend clinical correlation in this area.   Electronically Signed  By: Richarda OverlieAdam Henn M.D.  CLINICAL DATA: Evaluation of fracture seen on previous plain x-ray. Previous plain films demonstrated suspicion of subcapital left femoral neck fracture.  EXAM: CT OF THE LEFT FEMUR WITHOUT CONTRAST  TECHNIQUE: Multidetector CT imaging was performed according to the standard protocol. Multiplanar CT image reconstructions were also generated.  COMPARISON: Plain radiographs 10/04/2014  FINDINGS: Examination is technically limited due to difficulty with patient positioning, motion and uncooperative patient.  Diffuse bone demineralization. There is sclerosis in the left femoral neck with cortical irregularity consistent with nondisplaced fracture. No dislocation of the hip. Linear lucency with cortical step-off in the distal left femoral metaphysis consistent with nondisplaced fracture here is well. Visualized pelvis appears intact. Postoperative changes with fixation hardware in the right hip.  IMPRESSION: Nondisplaced fractures demonstrated in the left femoral neck and in the distal left femoral metaphysis.   Electronically Signed  By: Burman NievesWilliam Stevens M.D.  ROS  Difficult to  obtain due to minimal conversation Son recalls that he has difficulty eating on his own, dysphagia No recent pneumonia No chest pains No GI issues reported  Blood pressure 125/55, pulse 67, temperature 97.8 F (36.6 C), temperature source Oral, resp. rate 17, height 5\' 10"  (1.778 m), weight  76.476 kg (168 lb 9.6 oz), SpO2 99 %.  Physical Exam Laying in bed in a ball basically on his right side Awakens to loud questioning but then falls asleep  Some tenderness to left knee area No significant deformity  gently attempts at ROM of left hip challenging to evaluate in setting of knee  Assessment/Plan:  Based on CT findings, nondisplaced fractures demonstrated in the left femoral neck and in the distal left femoral metaphysis, I spoke to his son to get an idea of his baseline.  Given these findings and discussion I have concluded that I'd like to try and treat this without and operation Ordered knee immobilizer for left LE to be kept immobile  NON weight bearing left lower extremity until fracture healing evident Would anticipate fracture healing provided no further set backs  Reviewed with his son this plan  PT can assess needs SW to arrange plans for return to SNF with above recs and follow up in office  Will follow while in the hosiptal  Bentlie Catanzaro D 10/05/2014, 11:58 AM

## 2014-10-05 NOTE — Progress Notes (Signed)
INITIAL NUTRITION ASSESSMENT  DOCUMENTATION CODES Per approved criteria  -Not Applicable   INTERVENTION: -Recommend diet texture downgrade to Dys1/thin liquids -Recommend 2PM and 8PM MightyShake nourishments, each supplement provides 220 kcal, 8 gram protein -RD to continue to monitor  NUTRITION DIAGNOSIS: Increased nutrient needs related to chronic diease/healing as evidenced by left femoral neck fracture, stg 1 pressure ulcer on heels.   Goal: Pt to meet >/= 90% of their estimated nutrition needs    Monitor:  Total protein/energy intake, labs, weights, swallow profile  Reason for Assessment: Consult to Assess/Low Braden  80 y.o. male  Admitting Dx: <principal problem not specified>  ASSESSMENT: [id number] year old male who today was brought to the ED from skilled nursing facility after patient had a fall.patient is a poor historian and unable to provide any history  -RD contacted SNF to discuss food/nutrition hx as pt unable to assist -SNF RN reported pt tolerating puree diet with thin liquids. Had a fair appetite, and 50% meal completion. Able to feed self -Was supplemented with 10AM snack of ice cream, and MightyShake supplements BID, which pt enjoyed and consumed 100% -Denied any recent unintentional weight loss; however previous medical records indicate pt with a 22 lb weight loss since 12/2013 (12% body weight loss, non-severe for time frame) -Currently, pt on Carb Mod diet with 0% PO intake. Per discussion with RN, pt able to tolerate medications in pudding w/out difficulty. -Orthopedics recommended against operation for pt's fractures in left femoral neck and distal left femoral metaphysis, plan to start knee immobilizers for healing process   Height: Ht Readings from Last 1 Encounters:  10/05/14 5\' 10"  (1.778 m)    Weight: Wt Readings from Last 1 Encounters:  10/05/14 168 lb 9.6 oz (76.476 kg)    Ideal Body Weight: 166 lb  % Ideal Body Weight: 101%  Wt Readings  from Last 10 Encounters:  10/05/14 168 lb 9.6 oz (76.476 kg)  12/16/13 190 lb (86.183 kg)  08/14/13 190 lb (86.183 kg)  04/13/11 191 lb (86.637 kg)    Usual Body Weight: 190 lb in 12/2013  % Usual Body Weight: 88%  BMI:  Body mass index is 24.19 kg/(m^2).  Estimated Nutritional Needs: Kcal: 1800-2000 Protein: 85-95 gram Fluid: >/= 1800 ml daily  Skin: stg 1 bilateral heels pressure ulcer  Diet Order: Diet Carb Modified  EDUCATION NEEDS: -No education needs identified at this time   Intake/Output Summary (Last 24 hours) at 10/05/14 1438 Last data filed at 10/05/14 1238  Gross per 24 hour  Intake  867.5 ml  Output    575 ml  Net  292.5 ml    Last BM: 11/02   Labs:   Recent Labs Lab 10/04/14 1900 10/05/14 0400  NA 133* 137  K 4.3 4.1  CL 98 101  CO2 21 22  BUN 48* 49*  CREATININE 1.68* 1.70*  CALCIUM 8.4 8.4  GLUCOSE 157* 165*    CBG (last 3)   Recent Labs  10/05/14 1044 10/05/14 1234  GLUCAP 144* 150*    Scheduled Meds: . antiseptic oral rinse  7 mL Mouth Rinse BID  . aspirin EC  81 mg Oral Daily  . enoxaparin (LOVENOX) injection  30 mg Subcutaneous Q24H  . fentaNYL  25 mcg Transdermal Q72H  . [START ON 10/06/2014] Influenza vac split quadrivalent PF  0.5 mL Intramuscular Tomorrow-1000  . insulin aspart  0-15 Units Subcutaneous 6 times per day  . insulin glargine  31 Units Subcutaneous Daily  . levothyroxine  50 mcg Oral QAC breakfast  . [START ON 10/06/2014] pneumococcal 23 valent vaccine  0.5 mL Intramuscular Tomorrow-1000  . zolpidem  2.5 mg Oral QHS    Continuous Infusions: . sodium chloride 75 mL/hr at 10/05/14 [id number]    Past Medical History  Diagnosis Date  . Dysphagia 04/13/2011  . GERD (gastroesophageal reflux disease) 04/13/2011  . 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 Atrioventricular block, complete 08/14/2013  . Pacemaker-St. Jude's 08/14/2013    Past Surgical History  Procedure Laterality Date  .  Esophagogastroduodenoscopy  05/19/2011    Lloyd HugerSarah F Devyn Griffing MS RD LDN Clinical Dietitian [phone]

## 2014-10-05 NOTE — H&P (Signed)
PCP:   QUColon BranchESHI, AYYAZ, MD   Chief Complaint:  Fall  HPI:  80 year old male who  has a past medical history of Dysphagia (04/13/2011); GERD (gastroesophageal reflux disease) (04/13/2011); Diabetes mellitus due to abnormal insulin; Atrioventricular block, complete (08/14/2013); and Pacemaker-St. Jude's (08/14/2013). Today was brought to the ED from skilled nursing facility after patient had a fall.patient is a poor historian and unable to provide any history. In the ED the imaging studies revealed nondisplaced left femoral neck fracture as well as distal femoral metaphysis fracture. No other history is obtainable at this time. Called the son, and discussed with him. As per son patient usually able to talk, feed himself.  Patient has a history of complete heart block and has pacemaker in place. Review of systems not obtainable at this time due to patient's somnolence.  Allergies:  No Known Allergies    Past Medical History  Diagnosis Date  . Dysphagia 04/13/2011  . GERD (gastroesophageal reflux disease) 04/13/2011  . 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 Atrioventricular block, complete 08/14/2013  . Pacemaker-St. Jude's 08/14/2013    Past Surgical History  Procedure Laterality Date  . Esophagogastroduodenoscopy  05/19/2011    Prior to Admission medications   Medication Sig Start Date End Date Taking? Authorizing Provider  aspirin 81 MG tablet Take 81 mg by mouth daily.     Yes Historical Provider, MD  cetirizine (ZYRTEC) 10 MG tablet Take 10 mg by mouth daily. For 6 weeks for itching   Yes Historical Provider, MD  Eyelid Cleansers (OCUSOFT LID SCRUB) PADS Apply 1 each topically 2 (two) times daily.   Yes Historical Provider, MD  glipiZIDE (GLUCOTROL) 2.5 mg TABS tablet Take 2.5 mg by mouth daily before breakfast.   Yes Historical Provider, MD  hydrochlorothiazide (MICROZIDE) 12.5 MG capsule Take 12.5 mg by mouth. By mouth qday 06/04/09  Yes Historical Provider, MD  insulin  glargine (LANTUS) 100 UNIT/ML injection Inject 31 Units into the skin daily.  11/13/12  Yes Historical Provider, MD  ipratropium-albuterol (DUONEB) 0.5-2.5 (3) MG/3ML SOLN Take 3 mLs by nebulization every 6 (six) hours as needed.   Yes Historical Provider, MD  levothyroxine (SYNTHROID, LEVOTHROID) 50 MCG tablet Take 50 mcg by mouth daily before breakfast.   Yes Historical Provider, MD  multivitamin (THERAGRAN) per tablet Take 1 tablet by mouth daily.     Yes Historical Provider, MD  oxycodone (OXY-IR) 5 MG capsule Take 5 mg by mouth every 6 (six) hours as needed.    Yes Historical Provider, MD  polyethylene glycol (MIRALAX / GLYCOLAX) packet Take 17 g by mouth daily as needed.    Yes Historical Provider, MD  ranitidine (ZANTAC) 75 MG tablet Take 75 mg by mouth at bedtime.   Yes Historical Provider, MD  Vitamin D, Ergocalciferol, (DRISDOL) 50000 UNITS CAPS capsule Take 50,000 Units by mouth every 7 (seven) days. For 8 weeks.   Yes Historical Provider, MD  Amino Acids-Protein Hydrolys (FEEDING SUPPLEMENT, PRO-STAT SUGAR FREE 64,) LIQD Take 30 mLs by mouth 3 (three) times daily with meals.    Historical Provider, MD  baci-polymyx-neo-hydrocort (CORTISPORIN) 1 % ointment Place 1 % into both eyes 4 (four) times daily. 06/26/13   Historical Provider, MD  Cholecalciferol (D-3-5) 5000 UNITS capsule Take 5,000 Units by mouth daily.    Historical Provider, MD  fentaNYL (DURAGESIC - DOSED MCG/HR) 25 MCG/HR patch Place 1 patch onto the skin every 3 (three) days. 03/15/13   Historical Provider, MD  guaiFENesin (ROBITUSSIN) 100  MG/5ML liquid Take 200 mg by mouth every 4 (four) hours as needed for cough.    Historical Provider, MD  insulin lispro (HUMALOG) 100 UNIT/ML injection Inject into the skin as directed. Per sliding scale    Historical Provider, MD  mirtazapine (REMERON) 7.5 MG tablet Take 7.5 mg by mouth at bedtime.    Historical Provider, MD  pseudoephedrine (SUDAFED) 60 MG tablet Take 60 mg by mouth every 6  (six) hours as needed for congestion.    Historical Provider, MD  vitamin C (ASCORBIC ACID) 500 MG tablet Take 500 mg by mouth daily.    Historical Provider, MD  zolpidem (AMBIEN) 5 MG tablet Take 2.5 mg by mouth at bedtime.    Historical Provider, MD    Social History:  reports that he has never smoked. He has never used smokeless tobacco. His alcohol and drug histories are not on file.  Family History  Problem Relation Age of Onset  . Kidney failure    . Anemia    . Thrombocytopenia    . Dysphagia    . Diabetes type II    . Obesity    . Constipation    . Muscular dystrophy    . Hypertension    . Dysphagia          Physical Exam: Blood pressure 100/40, pulse 69, temperature 99 F (37.2 C), temperature source Axillary, resp. rate 9, SpO2 94 %. Constitutional:   Patient is a well-developed and well-nourished Male* in no acute distress and cooperative with exam. Head: Normocephalic and atraumatic Mouth: Mucus membranes moist Eyes: PERRL, EOMI, conjunctivae normal Neck: Supple, No Thyromegaly Cardiovascular: RRR, S1 normal, S2 normal Pulmonary/Chest: CTAB, no wheezes, rales, or rhonchi Abdominal: Soft. Non-tender, non-distended, bowel sounds are normal, no masses, organomegaly, or guarding present.  Neurological: somnolent,  Extremities : No Cyanosis, Clubbing or Edema  Labs on Admission:  Basic Metabolic Panel:  Recent Labs Lab 10/04/14 1900  NA 133*  K 4.3  CL 98  CO2 21  GLUCOSE 157*  BUN 48*  CREATININE 1.68*  CALCIUM 8.4   Liver Function Tests:  Recent Labs Lab 10/04/14 1900  AST 15  ALT 15  ALKPHOS 112  BILITOT 0.7  PROT 6.9  ALBUMIN 2.8*   No results for input(s): LIPASE, AMYLASE in the last 168 hours. No results for input(s): AMMONIA in the last 168 hours. CBC:  Recent Labs Lab 10/04/14 1900  WBC 9.9  NEUTROABS 7.7  HGB 10.4*  HCT 30.2*  MCV 95.9  PLT 125*   Cardiac Enzymes: No results for input(s): CKTOTAL, CKMB, CKMBINDEX,  TROPONINI in the last 168 hours.  BNP (last 3 results) No results for input(s): PROBNP in the last 8760 hours. CBG: No results for input(s): GLUCAP in the last 168 hours.  Radiological Exams on Admission: Dg Chest 1 View  10/04/2014   CLINICAL DATA:  Recent fall with chest pain  EXAM: CHEST - 1 VIEW  COMPARISON:  11/21/2005  FINDINGS: A pacing device is again seen. The cardiac shadow is within normal limits. The examination is limited secondary to the patient being unable to position himself. No focal infiltrate is seen. No definitive bony abnormality is seen.  IMPRESSION: No acute abnormality noted.   Electronically Signed   By: Alcide Clever M.D.   On: 10/04/2014 20:34   Dg Hip Bilateral W/pelvis  10/04/2014   CLINICAL DATA:  Initial encounter. Fall 2 days ago. RIGHT and LEFT hip pain.  EXAM: BILATERAL HIP WITH PELVIS -  4+ VIEW  COMPARISON:  None.  FINDINGS: RIGHT hip dynamic screw fixation is present. No complicating features. Encircling cables are present around the [facility] and proximal femoral plate. Prominent stool is present within the rectum. Radiographs are technically suboptimal due to obliquity and contracture of the RIGHT hip.  The LEFT hip lateral views are technically suboptimal. No displaced fracture is identified. On the lateral view, the lateral margin of the cortex is difficult to visualize but does appear contiguous.  Atherosclerosis is present.  IMPRESSION: No acute osseous abnormality. Contracture of the LEFT hip. Old ORIF of the RIGHT hip.   Electronically Signed   By: Andreas Newport M.D.   On: 10/04/2014 18:55   Dg Femur Left  10/04/2014   CLINICAL DATA:  Recent fall with left femur pain  EXAM: LEFT FEMUR - 2 VIEW  COMPARISON:  None.  FINDINGS: Generalized osteopenia is noted. The known distal femoral fractures are not well appreciated on these images but better seen on the recent knee image. There is some suggestion of a subcapital neck fracture.  IMPRESSION: Suspicion for a  subcapital left femoral neck fracture. CT may be helpful for further evaluation.   Electronically Signed   By: Alcide Clever M.D.   On: 10/04/2014 20:37   Dg Femur Right  10/04/2014   CLINICAL DATA:  Recent fall with pain  EXAM: RIGHT FEMUR - 2 VIEW  COMPARISON:  None.  FINDINGS: Postsurgical changes are noted in the femur with fixation sideplate and multiple fixation screws. The previously seen fractures have is and healed. No new fractures are seen. Generalized osteopenia is noted.  IMPRESSION: Chronic changes without acute abnormality.   Electronically Signed   By: Alcide Clever M.D.   On: 10/04/2014 20:38   Ct Head Wo Contrast  10/04/2014   CLINICAL DATA:  Confusion and pain post trauma 2 days prior  EXAM: CT HEAD WITHOUT CONTRAST  CT CERVICAL SPINE WITHOUT CONTRAST  TECHNIQUE: Multidetector CT imaging of the head and cervical spine was performed following the standard protocol without intravenous contrast. Multiplanar CT image reconstructions of the cervical spine were also generated.  COMPARISON:  None.  FINDINGS: CT HEAD FINDINGS  There is moderate diffuse atrophy. There is no intracranial mass, hemorrhage, extra-axial fluid collection, or midline shift. There is mild patchy small vessel disease in the centra semiovale bilaterally, somewhat more on the right than on the left. There is no acute appearing infarct. Bony calvarium appears intact. Mastoid air cells on the left are clear. There is diffuse opacification of hypoplastic mastoids on the right. There is debris in each external auditory canal. There is diffuse opacification in the right middle ear region without bony destruction. There is mucosal thickening in the right maxillary antrum as well as in the ethmoid air cells bilaterally. There is extensive opacification of the right sphenoid sinus.  CT CERVICAL SPINE FINDINGS  There is marked scoliosis. Bones are osteoporotic. There is no demonstrable fracture. There is 5 mm of anterolisthesis of C3 on  C4. There is no other appreciable spondylolisthesis. Prevertebral soft tissues and predental space regions are normal. There is moderate disc space narrowing at C3-4, C5-6, and C7-T1. There is multifocal osteoarthritic change. There is no frank disc extrusion or stenosis. Bones are osteoporotic. There is fibrosis in both apical regions.  IMPRESSION: CT head: Atrophy with periventricular small vessel disease. No intracranial mass, hemorrhage, or extra-axial fluid. There is diffuse opacification of hypoplastic mastoids on the right. There is multifocal paranasal sinus disease. There is  apparent cerumen in each external auditory canal. There is diffuse opacification in the right middle ear region without appreciable bony destruction.  CT cervical spine: No demonstrable fracture. Spondylolisthesis at C3-4 is felt to be due to underlying spondylosis. There is multifocal osteoarthritic change. There is scoliosis. Bones are osteoporotic.   Electronically Signed   By: Bretta BangWilliam  Woodruff M.D.   On: 10/04/2014 19:13   Ct Cervical Spine Wo Contrast  10/04/2014   CLINICAL DATA:  Confusion and pain post trauma 2 days prior  EXAM: CT HEAD WITHOUT CONTRAST  CT CERVICAL SPINE WITHOUT CONTRAST  TECHNIQUE: Multidetector CT imaging of the head and cervical spine was performed following the standard protocol without intravenous contrast. Multiplanar CT image reconstructions of the cervical spine were also generated.  COMPARISON:  None.  FINDINGS: CT HEAD FINDINGS  There is moderate diffuse atrophy. There is no intracranial mass, hemorrhage, extra-axial fluid collection, or midline shift. There is mild patchy small vessel disease in the centra semiovale bilaterally, somewhat more on the right than on the left. There is no acute appearing infarct. Bony calvarium appears intact. Mastoid air cells on the left are clear. There is diffuse opacification of hypoplastic mastoids on the right. There is debris in each external auditory canal.  There is diffuse opacification in the right middle ear region without bony destruction. There is mucosal thickening in the right maxillary antrum as well as in the ethmoid air cells bilaterally. There is extensive opacification of the right sphenoid sinus.  CT CERVICAL SPINE FINDINGS  There is marked scoliosis. Bones are osteoporotic. There is no demonstrable fracture. There is 5 mm of anterolisthesis of C3 on C4. There is no other appreciable spondylolisthesis. Prevertebral soft tissues and predental space regions are normal. There is moderate disc space narrowing at C3-4, C5-6, and C7-T1. There is multifocal osteoarthritic change. There is no frank disc extrusion or stenosis. Bones are osteoporotic. There is fibrosis in both apical regions.  IMPRESSION: CT head: Atrophy with periventricular small vessel disease. No intracranial mass, hemorrhage, or extra-axial fluid. There is diffuse opacification of hypoplastic mastoids on the right. There is multifocal paranasal sinus disease. There is apparent cerumen in each external auditory canal. There is diffuse opacification in the right middle ear region without appreciable bony destruction.  CT cervical spine: No demonstrable fracture. Spondylolisthesis at C3-4 is felt to be due to underlying spondylosis. There is multifocal osteoarthritic change. There is scoliosis. Bones are osteoporotic.   Electronically Signed   By: Bretta BangWilliam  Woodruff M.D.   On: 10/04/2014 19:13   Ct Femur Left Wo Contrast  10/04/2014   CLINICAL DATA:  Evaluation of fracture seen on previous plain x-ray. Previous plain films demonstrated suspicion of subcapital left femoral neck fracture.  EXAM: CT OF THE LEFT FEMUR WITHOUT CONTRAST  TECHNIQUE: Multidetector CT imaging was performed according to the standard protocol. Multiplanar CT image reconstructions were also generated.  COMPARISON:  Plain radiographs 10/04/2014  FINDINGS: Examination is technically limited due to difficulty with patient  positioning, motion and uncooperative patient.  Diffuse bone demineralization. There is sclerosis in the left femoral neck with cortical irregularity consistent with nondisplaced fracture. No dislocation of the hip. Linear lucency with cortical step-off in the distal left femoral metaphysis consistent with nondisplaced fracture here is well. Visualized pelvis appears intact. Postoperative changes with fixation hardware in the right hip.  IMPRESSION: Nondisplaced fractures demonstrated in the left femoral neck and in the distal left femoral metaphysis.   Electronically Signed   By: Burman NievesWilliam  Stevens  M.D.   On: 10/04/2014 23:23   Dg Knee Complete 4 Views Left  10/04/2014   CLINICAL DATA:  Fall 2 days ago.  Left knee pain.  EXAM: LEFT KNEE - COMPLETE 4+ VIEW  COMPARISON:  None.  FINDINGS: There is extensive osteopenia. There are extensive vascular calcifications. There is concern for a cortical step-off involving the lateral distal femur at the junction of the diaphysis and metaphysis. Fracture is not confidently identified on the cross-table lateral views. Tibial plateau and proximal fibula appear to be intact. There does not appear to be a large joint effusion.  IMPRESSION: Concern for a mildly displaced fracture involving the distal femur as described. This finding is not confirmed on all views and could be further evaluated with cross-sectional imaging. Recommend clinical correlation in this area.   Electronically Signed   By: Richarda Overlie M.D.   On: 10/04/2014 18:57       Assessment/Plan Active Problems:   Pacemaker-St. Jude's   Femur fracture, left   AKI (acute kidney injury)  Left femoral neck fracture Patient will be admitted to the hospital, will initiate hip fracture protocol. Orthopedics will be consulted by the ED physician, Dr  Sherlean Foot is on call for the [facility] continue with the fentanyl patch 25 g every sorry 2 hours and IV fentanyl 25 every 4 hours when necessary.  Acute  kidney injury Today patient's creatinine is 1.68, previous creatinine was 1.3 from 2012. Will continue with gentle IV hydration and follow BMP in a.m.  Complete heart block, status post pacemaker placement Will obtain EKG and monitor the patient on telemetry.  Diabetes mellitus We will start sliding scale insulinwith NovoLogantus 31 units at bedtime.  DVT prophylaxis Lovenox  Code status:Patient is DO NOT RESUSCITATE  Family discussion: called and discussed with patient's and he agrees with patient's DO NOT RESUSCITATE status.   Time Spent on Admission:   [facility] Pager: [phone] 10/05/2014, 12:43 AM  If 7PM-7AM, please contact night-coverage  [email or web address]  Password [email or web address]

## 2014-10-05 NOTE — Progress Notes (Addendum)
Patient ID: Collin Saucieraymond R Mounsey Sr., male   DOB: [birth year], 80 y.o.   MRN: [id number]  [facility] PROGRESS NOTE  Collin SaucierRaymond R Macneill Sr. [id number] DOB: [birth year] DOA: 10/04/2014 PCP: Colon BranchQURESHI, AYYAZ, MD  Brief narrative: 80 year old male with known history of dysphagia since 2012, GERD, DM, Atrioventricular block, complete (08/14/2013) and Pacemaker-St. Jude's (08/14/2013), [facility]F after an episode of fall. Pt unable to provide much history at the time of the admission and imaging studies in ED notable for left femoral neck fracture and distal femoral metaphysis fracture.   Assessment/Plan:    Principal Problem:   Femur fracture, left  non displaced fractures left femoral neck and distal left femoral metaphysis   per ortho, will try conservative management, non operative and see how pt does  ordered knee immobilizer for the left LE to keep immobile   non weight bearing left lower extremity until fracture healing evident   appreciate ortho assistance   will ask SW to arrange plans for return to SNF  Active Problems:    DM, controlled  continue Lantus 31 U QHS and SSI   A1C pending    AKI (acute kidney injury)  Cr still elevated, encouraged PO intake   will continue IVF NS 50 cc/hr, repeat BMP in AM   Pacemaker-St. Jude's  no chest pain, pt stable    Atrioventricular block, complete  Clinically stable at this time    Acute blood loss anemia  Drop in Hg since admission, possibly dilutional  No signs of active bleeding  CBC in AM   Hyponatremia  Likely secondary to pre renal etiology  IVF provided, Na is WNL this AM   Thrombocytopenia  Plt low but stable over the past 24 hours (110 K - 120 K)  No signs of active bleeding  Repeat CBC in AM   Hypothyroidism   Continue synthroid   DVT Prophylaxis:   Lovenox SQ  Code Status: DNR Family Communication: no family at bedside  Disposition Plan: will go back to SNF when medically  stable   IV Access:   Peripheral IV Procedures and diagnostic studies:    Dg Chest 1 View  10/04/2014  No acute abnormality noted.     Dg Hip Bilateral W/pelvis  10/04/2014  No acute osseous abnormality. Contracture of the LEFT hip. Old ORIF of the RIGHT hip.   Dg Femur Left  10/04/2014   Suspicion for a subcapital left femoral neck fracture.  Dg Femur Right  10/04/2014   Chronic changes without acute abnormality.    Ct Head Wo Contrast  10/04/2014  Atrophy with periventricular small vessel disease. No intracranial mass, hemorrhage, or extra-axial fluid. There is diffuse opacification of hypoplastic mastoids on the right. There is multifocal paranasal sinus disease. There is apparent cerumen in each external auditory canal. There is diffuse opacification in the right middle ear region without appreciable bony destruction.  CT cervical spine: No demonstrable fracture. Spondylolisthesis at C3-4 is felt to be due to underlying spondylosis. There is multifocal osteoarthritic change. There is scoliosis. Bones are osteoporotic.    Ct Femur Left Wo Contrast  10/04/2014   Nondisplaced fractures demonstrated in the left femoral neck and in the distal left femoral metaphysis.    Dg Knee Complete 4 Views Left  10/04/2014  Concern for a mildly displaced fracture involving the distal femur as described. This finding is not confirmed on all views and could be further evaluated with cross-sectional imaging. Recommend clinical correlation in  this area.  Medical Consultants:   Ortho  Other Consultants:   PT SW Anti-Infectives:   None   Manson PasseyEVINE, Derionna Salvador, MD  [facility] Pager [phone]  If 7PM-7AM, please contact night-coverage [email or web address] Password [email or web address] 10/05/2014, 4:19 PM   LOS: 1 day    HPI/Subjective: No acute overnight events.  Objective: Filed Vitals:   10/05/14 0603 10/05/14 0800 10/05/14 0900 10/05/14 1200  BP: 130/40  125/55   Pulse: 68  67   Temp: 98.1 F (36.7 C)  97.8 F (36.6  C)   TempSrc: Oral  Oral   Resp: 16 20 17 18   Height:      Weight:      SpO2: 95% 96% 99% 98%    Intake/Output Summary (Last 24 hours) at 10/05/14 1619 Last data filed at 10/05/14 1238  Gross per 24 hour  Intake  867.5 ml  Output    575 ml  Net  292.5 ml    Exam:   General:  Pt is alert, follows commands appropriately, not in acute distress  Cardiovascular: Regular rate and rhythm, no rubs and gallops   Respiratory: Clear to auscultation bilaterally, no wheezing, no crackles, no rhonchi  Abdomen: Soft, non tender, non distended, bowel sounds present  Extremities: TTP in the left hip area, pulses DP and PT palpable bilaterally  Data Reviewed: Basic Metabolic Panel:  Recent Labs Lab 10/04/14 1900 10/05/14 0400  NA 133* 137  K 4.3 4.1  CL 98 101  CO2 21 22  GLUCOSE 157* 165*  BUN 48* 49*  CREATININE 1.68* 1.70*  CALCIUM 8.4 8.4   Liver Function Tests:  Recent Labs Lab 10/04/14 1900  AST 15  ALT 15  ALKPHOS 112  BILITOT 0.7  PROT 6.9  ALBUMIN 2.8*   CBC:  Recent Labs Lab 10/04/14 1900 10/05/14 0400  WBC 9.9 8.5  NEUTROABS 7.7  --   HGB 10.4* 9.5*  HCT 30.2* 28.1*  MCV 95.9 95.6  PLT 125* 115*   CBG:  Recent Labs Lab 10/05/14 1044 10/05/14 1234  GLUCAP 144* 150*    Recent Results (from the past 240 hour(s))  MRSA PCR Screening     Status: None   Collection Time: 10/05/14  2:12 AM  Result Value Ref Range Status   MRSA by PCR NEGATIVE NEGATIVE Final    Comment:        The GeneXpert MRSA Assay (FDA approved for NASAL specimens only), is one component of a comprehensive MRSA colonization surveillance program. It is not intended to diagnose MRSA infection nor to guide or monitor treatment for MRSA infections.      Scheduled Meds: . aspirin EC  81 mg Oral Daily  . enoxaparin  injection  30 mg Subcutaneous Q24H  . fentaNYL  25 mcg Transdermal Q72H  . insulin aspart  0-15 Units Subcutaneous 6 times per day  . insulin glargine   31 Units Subcutaneous Daily  . levothyroxine  50 mcg Oral QAC breakfast  . zolpidem  2.5 mg Oral QHS   Continuous Infusions: . sodium chloride 75 mL/hr at 10/05/14 [phone]

## 2014-10-05 NOTE — Plan of Care (Signed)
Problem: Phase I Progression Outcomes Goal: Pre op NPO per MD orders Outcome: Completed/Met Date Met:  10/05/14

## 2014-10-05 NOTE — Progress Notes (Signed)
Attempted to get report from ED. No answer. Will await return phone call. [name], [name] I

## 2014-10-06 DIAGNOSIS — D696 Thrombocytopenia, unspecified: Secondary | ICD-10-CM

## 2014-10-06 DIAGNOSIS — E871 Hypo-osmolality and hyponatremia: Secondary | ICD-10-CM

## 2014-10-06 DIAGNOSIS — S72012A Unspecified intracapsular fracture of left femur, initial encounter for closed fracture: Secondary | ICD-10-CM | POA: Diagnosis not present

## 2014-10-06 LAB — CBC
HCT: 26.6 % — ABNORMAL LOW (ref 39.0–52.0)
Hemoglobin: 9.1 g/dL — ABNORMAL LOW (ref 13.0–17.0)
MCH: 33.6 pg (ref 26.0–34.0)
MCHC: 34.2 g/dL (ref 30.0–36.0)
MCV: 98.2 fL (ref 78.0–100.0)
PLATELETS: 107 10*3/uL — AB (ref 150–400)
RBC: 2.71 MIL/uL — AB (ref 4.22–5.81)
RDW: 14.6 % (ref 11.5–15.5)
WBC: 6.2 10*3/uL (ref 4.0–10.5)

## 2014-10-06 LAB — GLUCOSE, CAPILLARY
GLUCOSE-CAPILLARY: 59 mg/dL — AB (ref 70–99)
GLUCOSE-CAPILLARY: 93 mg/dL (ref 70–99)
Glucose-Capillary: 120 mg/dL — ABNORMAL HIGH (ref 70–99)
Glucose-Capillary: 125 mg/dL — ABNORMAL HIGH (ref 70–99)
Glucose-Capillary: 128 mg/dL — ABNORMAL HIGH (ref 70–99)
Glucose-Capillary: 67 mg/dL — ABNORMAL LOW (ref 70–99)
Glucose-Capillary: 79 mg/dL (ref 70–99)
Glucose-Capillary: 81 mg/dL (ref 70–99)
Glucose-Capillary: 87 mg/dL (ref 70–99)
Glucose-Capillary: 88 mg/dL (ref 70–99)
Glucose-Capillary: 99 mg/dL (ref 70–99)

## 2014-10-06 LAB — HEMOGLOBIN A1C
HEMOGLOBIN A1C: 6.8 % — AB (ref <5.7)
Mean Plasma Glucose: 148 mg/dL — ABNORMAL HIGH (ref <117)

## 2014-10-06 LAB — BASIC METABOLIC PANEL
ANION GAP: 12 (ref 5–15)
BUN: 44 mg/dL — ABNORMAL HIGH (ref 6–23)
CO2: 21 meq/L (ref 19–32)
Calcium: 8.3 mg/dL — ABNORMAL LOW (ref 8.4–10.5)
Chloride: 109 mEq/L (ref 96–112)
Creatinine, Ser: 1.53 mg/dL — ABNORMAL HIGH (ref 0.50–1.35)
GFR calc Af Amer: 43 mL/min — ABNORMAL LOW (ref >90)
GFR calc non Af Amer: 37 mL/min — ABNORMAL LOW (ref >90)
Glucose, Bld: 89 mg/dL (ref 70–99)
POTASSIUM: 3.8 meq/L (ref 3.7–5.3)
SODIUM: 142 meq/L (ref 137–147)

## 2014-10-06 MED ORDER — DEXTROSE-NACL 5-0.9 % IV SOLN
INTRAVENOUS | Status: DC
  Administered 2014-10-06: 17:00:00 via INTRAVENOUS

## 2014-10-06 MED ORDER — ZOLPIDEM TARTRATE 5 MG PO TABS: 2.5000 mg | ORAL_TABLET | Freq: Every evening | ORAL | Status: DC | PRN

## 2014-10-06 MED ORDER — DEXTROSE 50 % IV SOLN
INTRAVENOUS | Status: AC
  Administered 2014-10-06: 25 mL
  Filled 2014-10-06: qty 50

## 2014-10-06 MED ORDER — DEXTROSE 50 % IV SOLN
25.0000 mL | Freq: Once | INTRAVENOUS | Status: AC | PRN
  Administered 2014-10-06: 25 mL via INTRAVENOUS

## 2014-10-06 MED ORDER — DEXTROSE 50 % IV SOLN: 25.0000 mL | Freq: Once | INTRAVENOUS | Status: AC | PRN

## 2014-10-06 MED ORDER — ACETAMINOPHEN 325 MG PO TABS
650.0000 mg | ORAL_TABLET | Freq: Four times a day (QID) | ORAL | Status: DC | PRN
  Administered 2014-10-06 – 2014-10-07 (×2): 650 mg via ORAL
  Filled 2014-10-06 (×2): qty 2

## 2014-10-06 NOTE — Progress Notes (Signed)
Clinical Social Work Department BRIEF PSYCHOSOCIAL ASSESSMENT 10/06/2014  Patient:  Collin [name],Collin [name]     Account Number:  [id number]     Admit date:  10/04/2014  Clinical Social Worker:  Orpah GreekFOLEY,Collin [name], LCSWA  Date/Time:  10/06/2014 11:38 AM  Referred by:  Physician  Date Referred:  10/06/2014 Referred for  Other - See comment   Other Referral:   [facility]   Interview type:  Family Other interview type:   patient's son, Geographical information systems officeroger via phone    PSYCHOSOCIAL DATA Living Status:  FACILITY [facility] Level of care:  Skilled Nursing Facility Primary support name:  Collin [name] (son) c#: [phone] Primary support relationship to patient:  CHILD, ADULT Degree of support available:   good    CURRENT CONCERNS Current Concerns  Post-Acute Placement   Other Concerns:    SOCIAL WORK ASSESSMENT / PLAN CSW received consult that [facility]   Assessment/plan status:  Information/[facility]erral to [facility] Other assessment/ plan:   Information/referral to community resources:   CSW completed FL2 and [facility]    PATIENT'S/FAMILY'S RESPONSE TO PLAN OF CARE: CSW confirmed with patient's son that he plans for [facility]. CSW [facility]Kelly [facility] that [facility] would be able to take patient back when stable.       Lincoln MaxinKelly Ryott Rafferty, LCSW [facility] Clinical Social Worker cell #: [phone]

## 2014-10-06 NOTE — Progress Notes (Signed)
Hypoglycemic Event  CBG: 67  Treatment: D50 IV 25 mL  Symptoms: lethargic  Follow-up CBG: Time:1715 CBG Result:128  Possible Reasons for Event: Inadequate meal intake  Comments/MD notified:Dr. Regalado made aware, new orders received to change IV fluids    Alfredo Collymore, Joslyn DevonHilary Kelly  Remember to initiate Hypoglycemia Order Set & complete

## 2014-10-06 NOTE — Progress Notes (Signed)
[facility] PROGRESS NOTE  Collin [name] Sr. ZOX:[id number] DOB: Apr 22, 1917 DOA: 10/04/2014 PCP: Colon Branch, MD  Assessment/Plan: 1-Left Femur Fracture:  Non displaced.  Per ortho conservative management.  Immobilizer LE.  No weight bearing.   2-Encephalopathy;  Patient sleepy. Will avoid sedatives.   3-Diabetes, with episode of hypoglycemia; Will discontinue lantus.  SSI as needed.  Monitor for hypoglycemia.  Patient Blood sugar decreasing. Not eating a lot. Received 30 units of lantus. Will start D 5 IV fluids.   4-Acute on chronic renal failure:  Improving with IV fluids.  Last Cr per records at 1.3.   5-Hyponatremia;improved with IV fluids.   6-Hypothyroidism; continue with synthroid.  7-Thrombocytopenia;  Monitor. Chronic, platelet at 120 (3 years ago).   8-Anemia; HB stable at 9. Check Anemia panel.   DVT prophylaxis : Lovenox.   A-V block; S/P pacemaker.   Code Status: DNR Family Communication: none at bedside.  Disposition Plan: SNF when stable.    Consultants:  Ortho  Procedures:  none  Antibiotics:  none  HPI/Subjective: Sleepy, wake up and answer some questions. Denies pain. Mild cough. No dyspnea.   Objective: Filed Vitals:   10/06/14 0900  BP: 118/89  Pulse: 74  Temp: 98.2 F (36.8 C)  Resp: 22    Intake/Output Summary (Last 24 hours) at 10/06/14 1144 Last data filed at 10/06/14 0639  Gross per 24 hour  Intake    995 ml  Output    650 ml  Net    345 ml   Filed Weights   10/05/14 0145  Weight: 76.476 kg (168 lb 9.6 oz)    Exam:   General:  Sleepy, but wake up and answer some questions.   Cardiovascular: S 1, S 2 RRR  Respiratory: Bilateral ronchus  Abdomen: Bs present, soft, NT  Musculoskeletal: no edema, immobilizer Left LE   Data Reviewed: Basic Metabolic Panel:  Recent Labs Lab 10/04/14 1900 10/05/14 0400 10/06/14 0410  NA 133* 137 142  K 4.3 4.1 3.8  CL 98 101 109  CO2 21 22 21   GLUCOSE  157* 165* 89  BUN 48* 49* 44*  CREATININE 1.68* 1.70* 1.53*  CALCIUM 8.4 8.4 8.3*   Liver Function Tests:  Recent Labs Lab 10/04/14 1900  AST 15  ALT 15  ALKPHOS 112  BILITOT 0.7  PROT 6.9  ALBUMIN 2.8*   No results for input(s): LIPASE, AMYLASE in the last 168 hours. No results for input(s): AMMONIA in the last 168 hours. CBC:  Recent Labs Lab 10/04/14 1900 10/05/14 0400 10/06/14 0410  WBC 9.9 8.5 6.2  NEUTROABS 7.7  --   --   HGB 10.4* 9.5* 9.1*  HCT 30.2* 28.1* 26.6*  MCV 95.9 95.6 98.2  PLT 125* 115* 107*   Cardiac Enzymes: No results for input(s): CKTOTAL, CKMB, CKMBINDEX, TROPONINI in the last 168 hours. BNP (last 3 results) No results for input(s): PROBNP in the last 8760 hours. CBG:  Recent Labs Lab 10/05/14 2044 10/05/14 2345 10/06/14 0042 10/06/14 0459 10/06/14 0759  GLUCAP 168* 59* 120* 88 79    Recent Results (from the past 240 hour(s))  MRSA PCR Screening     Status: None   Collection Time: 10/05/14  2:12 AM  Result Value Ref Range Status   MRSA by PCR NEGATIVE NEGATIVE Final    Comment:        The GeneXpert MRSA Assay (FDA approved for NASAL specimens only), is one component of a comprehensive MRSA colonization surveillance program. It  is not intended to diagnose MRSA infection nor to guide or monitor treatment for MRSA infections.      Studies: Dg Chest 1 View  10/04/2014   CLINICAL DATA:  Recent fall with chest pain  EXAM: CHEST - 1 VIEW  COMPARISON:  11/21/2005  FINDINGS: A pacing device is again seen. The cardiac shadow is within normal limits. The examination is limited secondary to the patient being unable to position himself. No focal infiltrate is seen. No definitive bony abnormality is seen.  IMPRESSION: No acute abnormality noted.   Electronically Signed   By: Alcide CleverMark  Lukens M.D.   On: 10/04/2014 20:34   Dg Hip Bilateral W/pelvis  10/04/2014   CLINICAL DATA:  Initial encounter. Fall 2 days ago. RIGHT and LEFT hip pain.   EXAM: BILATERAL HIP WITH PELVIS - 4+ VIEW  COMPARISON:  None.  FINDINGS: RIGHT hip dynamic screw fixation is present. No complicating features. Encircling cables are present around the [facility] and proximal femoral plate. Prominent stool is present within the rectum. Radiographs are technically suboptimal due to obliquity and contracture of the RIGHT hip.  The LEFT hip lateral views are technically suboptimal. No displaced fracture is identified. On the lateral view, the lateral margin of the cortex is difficult to visualize but does appear contiguous.  Atherosclerosis is present.  IMPRESSION: No acute osseous abnormality. Contracture of the LEFT hip. Old ORIF of the RIGHT hip.   Electronically Signed   By: Andreas NewportGeoffrey  Lamke M.D.   On: 10/04/2014 18:55   Dg Femur Left  10/04/2014   CLINICAL DATA:  Recent fall with left femur pain  EXAM: LEFT FEMUR - 2 VIEW  COMPARISON:  None.  FINDINGS: Generalized osteopenia is noted. The known distal femoral fractures are not well appreciated on these images but better seen on the recent knee image. There is some suggestion of a subcapital neck fracture.  IMPRESSION: Suspicion for a subcapital left femoral neck fracture. CT may be helpful for further evaluation.   Electronically Signed   By: Alcide CleverMark  Lukens M.D.   On: 10/04/2014 20:37   Dg Femur Right  10/04/2014   CLINICAL DATA:  Recent fall with pain  EXAM: RIGHT FEMUR - 2 VIEW  COMPARISON:  None.  FINDINGS: Postsurgical changes are noted in the femur with fixation sideplate and multiple fixation screws. The previously seen fractures have is and healed. No new fractures are seen. Generalized osteopenia is noted.  IMPRESSION: Chronic changes without acute abnormality.   Electronically Signed   By: Alcide CleverMark  Lukens M.D.   On: 10/04/2014 20:38   Ct Head Wo Contrast  10/04/2014   CLINICAL DATA:  Confusion and pain post trauma 2 days prior  EXAM: CT HEAD WITHOUT CONTRAST  CT CERVICAL SPINE WITHOUT CONTRAST  TECHNIQUE: Multidetector CT  imaging of the head and cervical spine was performed following the standard protocol without intravenous contrast. Multiplanar CT image reconstructions of the cervical spine were also generated.  COMPARISON:  None.  FINDINGS: CT HEAD FINDINGS  There is moderate diffuse atrophy. There is no intracranial mass, hemorrhage, extra-axial fluid collection, or midline shift. There is mild patchy small vessel disease in the centra semiovale bilaterally, somewhat more on the right than on the left. There is no acute appearing infarct. Bony calvarium appears intact. Mastoid air cells on the left are clear. There is diffuse opacification of hypoplastic mastoids on the right. There is debris in each external auditory canal. There is diffuse opacification in the right middle ear region without bony  destruction. There is mucosal thickening in the right maxillary antrum as well as in the ethmoid air cells bilaterally. There is extensive opacification of the right sphenoid sinus.  CT CERVICAL SPINE FINDINGS  There is marked scoliosis. Bones are osteoporotic. There is no demonstrable fracture. There is 5 mm of anterolisthesis of C3 on C4. There is no other appreciable spondylolisthesis. Prevertebral soft tissues and predental space regions are normal. There is moderate disc space narrowing at C3-4, C5-6, and C7-T1. There is multifocal osteoarthritic change. There is no frank disc extrusion or stenosis. Bones are osteoporotic. There is fibrosis in both apical regions.  IMPRESSION: CT head: Atrophy with periventricular small vessel disease. No intracranial mass, hemorrhage, or extra-axial fluid. There is diffuse opacification of hypoplastic mastoids on the right. There is multifocal paranasal sinus disease. There is apparent cerumen in each external auditory canal. There is diffuse opacification in the right middle ear region without appreciable bony destruction.  CT cervical spine: No demonstrable fracture. Spondylolisthesis at C3-4  is felt to be due to underlying spondylosis. There is multifocal osteoarthritic change. There is scoliosis. Bones are osteoporotic.   Electronically Signed   By: Bretta BangWilliam  Woodruff M.D.   On: 10/04/2014 19:13   Ct Cervical Spine Wo Contrast  10/04/2014   CLINICAL DATA:  Confusion and pain post trauma 2 days prior  EXAM: CT HEAD WITHOUT CONTRAST  CT CERVICAL SPINE WITHOUT CONTRAST  TECHNIQUE: Multidetector CT imaging of the head and cervical spine was performed following the standard protocol without intravenous contrast. Multiplanar CT image reconstructions of the cervical spine were also generated.  COMPARISON:  None.  FINDINGS: CT HEAD FINDINGS  There is moderate diffuse atrophy. There is no intracranial mass, hemorrhage, extra-axial fluid collection, or midline shift. There is mild patchy small vessel disease in the centra semiovale bilaterally, somewhat more on the right than on the left. There is no acute appearing infarct. Bony calvarium appears intact. Mastoid air cells on the left are clear. There is diffuse opacification of hypoplastic mastoids on the right. There is debris in each external auditory canal. There is diffuse opacification in the right middle ear region without bony destruction. There is mucosal thickening in the right maxillary antrum as well as in the ethmoid air cells bilaterally. There is extensive opacification of the right sphenoid sinus.  CT CERVICAL SPINE FINDINGS  There is marked scoliosis. Bones are osteoporotic. There is no demonstrable fracture. There is 5 mm of anterolisthesis of C3 on C4. There is no other appreciable spondylolisthesis. Prevertebral soft tissues and predental space regions are normal. There is moderate disc space narrowing at C3-4, C5-6, and C7-T1. There is multifocal osteoarthritic change. There is no frank disc extrusion or stenosis. Bones are osteoporotic. There is fibrosis in both apical regions.  IMPRESSION: CT head: Atrophy with periventricular small  vessel disease. No intracranial mass, hemorrhage, or extra-axial fluid. There is diffuse opacification of hypoplastic mastoids on the right. There is multifocal paranasal sinus disease. There is apparent cerumen in each external auditory canal. There is diffuse opacification in the right middle ear region without appreciable bony destruction.  CT cervical spine: No demonstrable fracture. Spondylolisthesis at C3-4 is felt to be due to underlying spondylosis. There is multifocal osteoarthritic change. There is scoliosis. Bones are osteoporotic.   Electronically Signed   By: Bretta BangWilliam  Woodruff M.D.   On: 10/04/2014 19:13   Ct Femur Left Wo Contrast  10/04/2014   CLINICAL DATA:  Evaluation of fracture seen on previous plain x-ray. Previous plain films  demonstrated suspicion of subcapital left femoral neck fracture.  EXAM: CT OF THE LEFT FEMUR WITHOUT CONTRAST  TECHNIQUE: Multidetector CT imaging was performed according to the standard protocol. Multiplanar CT image reconstructions were also generated.  COMPARISON:  Plain radiographs 10/04/2014  FINDINGS: Examination is technically limited due to difficulty with patient positioning, motion and uncooperative patient.  Diffuse bone demineralization. There is sclerosis in the left femoral neck with cortical irregularity consistent with nondisplaced fracture. No dislocation of the hip. Linear lucency with cortical step-off in the distal left femoral metaphysis consistent with nondisplaced fracture here is well. Visualized pelvis appears intact. Postoperative changes with fixation hardware in the right hip.  IMPRESSION: Nondisplaced fractures demonstrated in the left femoral neck and in the distal left femoral metaphysis.   Electronically Signed   By: Burman Nieves M.D.   On: 10/04/2014 23:23   Dg Knee Complete 4 Views Left  10/04/2014   CLINICAL DATA:  Fall 2 days ago.  Left knee pain.  EXAM: LEFT KNEE - COMPLETE 4+ VIEW  COMPARISON:  None.  FINDINGS: There is  extensive osteopenia. There are extensive vascular calcifications. There is concern for a cortical step-off involving the lateral distal femur at the junction of the diaphysis and metaphysis. Fracture is not confidently identified on the cross-table lateral views. Tibial plateau and proximal fibula appear to be intact. There does not appear to be a large joint effusion.  IMPRESSION: Concern for a mildly displaced fracture involving the distal femur as described. This finding is not confirmed on all views and could be further evaluated with cross-sectional imaging. Recommend clinical correlation in this area.   Electronically Signed   By: Richarda Overlie M.D.   On: 10/04/2014 18:57    Scheduled Meds: . antiseptic oral rinse  7 mL Mouth Rinse BID  . aspirin EC  81 mg Oral Daily  . enoxaparin (LOVENOX) injection  30 mg Subcutaneous Q24H  . insulin aspart  0-15 Units Subcutaneous 6 times per day  . levothyroxine  50 mcg Oral QAC breakfast  . zolpidem  2.5 mg Oral QHS   Continuous Infusions: . sodium chloride 75 mL/hr at 10/06/14 0911    Principal Problem:   Femur fracture, left Active Problems:   Pacemaker-St. Jude's   Atrioventricular block, complete   AKI (acute kidney injury)   Hyponatremia   Thrombocytopenia   Acute blood loss anemia    Time spent: 35 minutes.     Hartley Barefoot A  [facility] Pager [phone]. If 7PM-7AM, please contact night-coverage at [email or web address], password [facility] 10/06/2014, 11:44 AM  LOS: 2 days

## 2014-10-06 NOTE — Progress Notes (Signed)
PT Cancellation Note  Patient Details Name: Collin SaucierRaymond R Mabe Sr. MRN: [id number] DOB: March 07, 1917   Cancelled Treatment:    Reason Eval/Treat Not Completed: Other (comment) (being fed breakfast at present. will return  later time this AM.)   Rada HayHill, [place] Hohn Elizabeth 10/06/2014, 8:35 AM Blanchard KelchKaren Ash Mcelwain PT [phone]

## 2014-10-06 NOTE — Evaluation (Signed)
Physical Therapy Evaluation Patient Details Name: Collin SaucierRaymond R Mkrtchyan Sr. MRN: [id number] DOB: [birth year] Today's Date: 10/06/2014   History of Present Illness  80 year old male who  has a past medical history of Dysphagia (04/13/2011); GERD (gastroesophageal reflux disease) (04/13/2011); Diabetes mellitus due to abnormal insulin; Atrioventricular block, complete (08/14/2013); and Pacemaker-St. Jude's (08/14/2013)., admitted 11/2/215 with nondisplaced L femoral neck and distal femur fx. Non surgical fractures, supported with KI.  Clinical Impression  Patient resistive for mobility to edge of bed. Patient will require lift equipment for out of bed. PT will be limited for  Mobility whille NWB on LLE. Pt wll provide trial  While in acute care.    Follow Up Recommendations SNF;Supervision/Assistance - 24 hour (skilled therapy will be very limited given NWB and total assist required.)    Equipment Recommendations  None recommended by PT    Recommendations for Other Services       Precautions / Restrictions Precautions Precautions: Fall Required Braces or Orthoses: Knee Immobilizer - Left Knee Immobilizer - Left: On at all times Restrictions Weight Bearing Restrictions: Yes LLE Weight Bearing: Non weight bearing      Mobility  Bed Mobility Overal bed mobility: Needs Assistance;+ 2 for safety/equipment;+2 for physical assistance Bed Mobility: Supine to Sit;Sit to Supine     Supine to sit: Total assist;+2 for physical assistance;HOB elevated;+2 for safety/equipment Sit to supine: Total assist;+2 for physical assistance;+2 for safety/equipment   General bed mobility comments: purple slide sheet used to facilitate beed mobility, patient is resistive, expresses pain.  Transfers  not attempted.                  Ambulation/Gait                Stairs            Wheelchair Mobility    Modified Rankin (Stroke Patients Only)       Balance Overall balance  assessment: History of Falls;Needs assistance Sitting-balance support: No upper extremity supported;Feet unsupported Sitting balance-Leahy Scale: Zero Sitting balance - Comments: pt is resistive to moving to edge and back into bed, patient would not remain sitting and attempted to hit therapist and lay down.                                     Pertinent Vitals/Pain Pain Assessment: Faces Faces Pain Scale: Hurts worst Pain Location: LLE when sitting up Pain Descriptors / Indicators: Grimacing Pain Intervention(s): Limited activity within patient's tolerance;Premedicated before session    Home Living Family/patient expects to be discharged to:: Skilled nursing facility                      Prior Function           Comments: unknown, chart reports feeds himself, mobility unknown     Hand Dominance        Extremity/Trunk Assessment               Lower Extremity Assessment: LLE deficits/detail;RLE deficits/detail   LLE Deficits / Details: ki in place, total assit to move     Communication      Cognition Arousal/Alertness: Awake/alert   Overall Cognitive Status: History of cognitive impairments - at baseline                      General Comments      Exercises  Assessment/Plan    PT Assessment    PT Diagnosis Acute pain;Altered mental status   PT Problem List    PT Treatment Interventions     PT Goals (Current goals can be found in the Care Plan section) Acute Rehab PT Goals Patient Stated Goal: pt unable PT Goal Formulation: Patient unable to participate in goal setting Time For Goal Achievement: 10/20/14 Potential to Achieve Goals: Fair    Frequency     Barriers to discharge        Co-evaluation               End of Session   Activity Tolerance: Patient limited by pain;Treatment limited secondary to agitation Patient left: in bed;with call bell/phone within reach;with bed alarm set Nurse  Communication: Mobility status;Need for lift equipment         Time: [phone] PT Time Calculation (min): 15 min   Charges:   PT Evaluation $Initial PT Evaluation Tier I: 1 Procedure PT Treatments $Therapeutic Activity: 8-22 mins   PT G Codes:          Sharen HeckHill, Jerron Niblack Elizabeth Jaken Fregia PT [phone]   10/06/2014, 10:03 AM

## 2014-10-07 LAB — BASIC METABOLIC PANEL
ANION GAP: 13 (ref 5–15)
BUN: 37 mg/dL — ABNORMAL HIGH (ref 6–23)
CALCIUM: 8.1 mg/dL — AB (ref 8.4–10.5)
CHLORIDE: 113 meq/L — AB (ref 96–112)
CO2: 21 mEq/L (ref 19–32)
Creatinine, Ser: 1.56 mg/dL — ABNORMAL HIGH (ref 0.50–1.35)
GFR calc Af Amer: 42 mL/min — ABNORMAL LOW (ref >90)
GFR, EST NON AFRICAN AMERICAN: 36 mL/min — AB (ref >90)
Glucose, Bld: 100 mg/dL — ABNORMAL HIGH (ref 70–99)
Potassium: 3.8 mEq/L (ref 3.7–5.3)
Sodium: 147 mEq/L (ref 137–147)

## 2014-10-07 LAB — IRON AND TIBC
Iron: 23 ug/dL — ABNORMAL LOW (ref 42–135)
Saturation Ratios: 13 % — ABNORMAL LOW (ref 20–55)
TIBC: 175 ug/dL — AB (ref 215–435)
UIBC: 152 ug/dL (ref 125–400)

## 2014-10-07 LAB — CBC
HCT: 26.2 % — ABNORMAL LOW (ref 39.0–52.0)
Hemoglobin: 8.7 g/dL — ABNORMAL LOW (ref 13.0–17.0)
MCH: 32.8 pg (ref 26.0–34.0)
MCHC: 33.2 g/dL (ref 30.0–36.0)
MCV: 98.9 fL (ref 78.0–100.0)
PLATELETS: 100 10*3/uL — AB (ref 150–400)
RBC: 2.65 MIL/uL — ABNORMAL LOW (ref 4.22–5.81)
RDW: 14.6 % (ref 11.5–15.5)
WBC: 5.9 10*3/uL (ref 4.0–10.5)

## 2014-10-07 LAB — GLUCOSE, CAPILLARY
GLUCOSE-CAPILLARY: 100 mg/dL — AB (ref 70–99)
GLUCOSE-CAPILLARY: 116 mg/dL — AB (ref 70–99)
GLUCOSE-CAPILLARY: 137 mg/dL — AB (ref 70–99)
GLUCOSE-CAPILLARY: 188 mg/dL — AB (ref 70–99)
GLUCOSE-CAPILLARY: 192 mg/dL — AB (ref 70–99)
Glucose-Capillary: 103 mg/dL — ABNORMAL HIGH (ref 70–99)
Glucose-Capillary: 120 mg/dL — ABNORMAL HIGH (ref 70–99)

## 2014-10-07 LAB — RETICULOCYTES
RBC.: 2.65 MIL/uL — ABNORMAL LOW (ref 4.22–5.81)
RETIC CT PCT: 2.9 % (ref 0.4–3.1)
Retic Count, Absolute: 76.9 10*3/uL (ref 19.0–186.0)

## 2014-10-07 LAB — VITAMIN B12: Vitamin B-12: 1025 pg/mL — ABNORMAL HIGH (ref 211–911)

## 2014-10-07 LAB — FOLATE: Folate: >20 ng/mL

## 2014-10-07 LAB — FERRITIN: FERRITIN: 414 ng/mL — AB (ref 22–322)

## 2014-10-07 MED ORDER — FERROUS SULFATE 325 (65 FE) MG PO TABS
325.0000 mg | ORAL_TABLET | Freq: Three times a day (TID) | ORAL | Status: DC
  Administered 2014-10-08 (×2): 325 mg via ORAL
  Filled 2014-10-07 (×4): qty 1

## 2014-10-07 MED ORDER — ENSURE COMPLETE PO LIQD
237.0000 mL | Freq: Two times a day (BID) | ORAL | Status: DC
  Administered 2014-10-08: 237 mL via ORAL

## 2014-10-07 MED ORDER — SODIUM CHLORIDE 0.9 % IV SOLN
INTRAVENOUS | Status: DC
  Administered 2014-10-07 – 2014-10-08 (×2): via INTRAVENOUS

## 2014-10-07 NOTE — Progress Notes (Signed)
[facility] PROGRESS NOTE  Collin SaucierRaymond R Miano Sr. HYQ:[id number] DOB: [birth year] DOA: 10/04/2014 PCP: Colon BranchQURESHI, AYYAZ, MD  Assessment/Plan: 1-Left Femur Fracture:  Non displaced.  Per ortho conservative management.  Immobilizer LE.  No weight bearing.   2-Encephalopathy;  Will avoid sedatives.  More alert today.   3-Diabetes, with episode of hypoglycemia; Continue to hold lantus.  SSI as needed.  Monitor for hypoglycemia.  Patient Blood sugar decreasing. Not eating a lot. Received 30 units of lantus. Will start D 5 IV fluids.   4-Acute on chronic renal failure:  Improving with IV fluids. Cr decrease to 1.5 Last Cr per records at 1.3.  Discontinue foley catheter and voiding trial.   5-Hyponatremia;improved with IV fluids.   6-Hypothyroidism; continue with synthroid.  7-Thrombocytopenia;  Monitor. Chronic, platelet at 120 (3 years ago).   8-Anemia; HB stable at 9. Iron deficiency anemia. Start ferrous sulfate.   DVT prophylaxis : Lovenox.   A-V block; S/P pacemaker.   Code Status: DNR Family Communication: none at bedside.  Disposition Plan: SNF when stable.    Consultants:  Ortho  Procedures:  none  Antibiotics:  none  HPI/Subjective: More alert today. Per nurse he drinks more that what he eats. Patient denies pain. No dyspnea.   Objective: Filed Vitals:   10/07/14 1451  BP: 124/36  Pulse: 69  Temp: 97.9 F (36.6 C)  Resp: 18    Intake/Output Summary (Last 24 hours) at 10/07/14 1837 Last data filed at 10/07/14 1404  Gross per 24 hour  Intake    950 ml  Output    600 ml  Net    350 ml   Filed Weights   10/05/14 0145  Weight: 76.476 kg (168 lb 9.6 oz)    Exam:   General:  Sleepy, but wake up and answer some questions.   Cardiovascular: S 1, S 2 RRR  Respiratory: Bilateral ronchus  Abdomen: Bs present, soft, NT  Musculoskeletal: no edema, immobilizer Left LE   Data Reviewed: Basic Metabolic Panel:  Recent Labs Lab  10/04/14 1900 10/05/14 0400 10/06/14 0410 10/07/14 0335  NA 133* 137 142 147  K 4.3 4.1 3.8 3.8  CL 98 101 109 113*  CO2 21 22 21 21   GLUCOSE 157* 165* 89 100*  BUN 48* 49* 44* 37*  CREATININE 1.68* 1.70* 1.53* 1.56*  CALCIUM 8.4 8.4 8.3* 8.1*   Liver Function Tests:  Recent Labs Lab 10/04/14 1900  AST 15  ALT 15  ALKPHOS 112  BILITOT 0.7  PROT 6.9  ALBUMIN 2.8*   No results for input(s): LIPASE, AMYLASE in the last 168 hours. No results for input(s): AMMONIA in the last 168 hours. CBC:  Recent Labs Lab 10/04/14 1900 10/05/14 0400 10/06/14 0410 10/07/14 0335  WBC 9.9 8.5 6.2 5.9  NEUTROABS 7.7  --   --   --   HGB 10.4* 9.5* 9.1* 8.7*  HCT 30.2* 28.1* 26.6* 26.2*  MCV 95.9 95.6 98.2 98.9  PLT 125* 115* 107* 100*   Cardiac Enzymes: No results for input(s): CKTOTAL, CKMB, CKMBINDEX, TROPONINI in the last 168 hours. BNP (last 3 results) No results for input(s): PROBNP in the last 8760 hours. CBG:  Recent Labs Lab 10/07/14 0156 10/07/14 0416 10/07/14 0741 10/07/14 1143 10/07/14 1603  GLUCAP 116* 100* 103* 188* 192*    Recent Results (from the past 240 hour(s))  MRSA PCR Screening     Status: None   Collection Time: 10/05/14  2:12 AM  Result Value Ref Range Status  MRSA by PCR NEGATIVE NEGATIVE Final    Comment:        The GeneXpert MRSA Assay (FDA approved for NASAL specimens only), is one component of a comprehensive MRSA colonization surveillance program. It is not intended to diagnose MRSA infection nor to guide or monitor treatment for MRSA infections.      Studies: No results found.  Scheduled Meds: . antiseptic oral rinse  7 mL Mouth Rinse BID  . aspirin EC  81 mg Oral Daily  . enoxaparin (LOVENOX) injection  30 mg Subcutaneous Q24H  . insulin aspart  0-15 Units Subcutaneous 6 times per day  . levothyroxine  50 mcg Oral QAC breakfast   Continuous Infusions: . dextrose 5 % and 0.9% NaCl 50 mL/hr at 10/06/14 1711    Principal  Problem:   Femur fracture, left Active Problems:   Pacemaker-St. Jude's   Atrioventricular block, complete   AKI (acute kidney injury)   Hyponatremia   Thrombocytopenia   Acute blood loss anemia    Time spent: 35 minutes.     Collin [name] A  [facility] Pager [phone]. If 7PM-7AM, please contact night-coverage at [email or web address], password [email or web address] 10/07/2014, 6:37 PM  LOS: 3 days

## 2014-10-08 LAB — GLUCOSE, CAPILLARY
Glucose-Capillary: 129 mg/dL — ABNORMAL HIGH (ref 70–99)
Glucose-Capillary: 136 mg/dL — ABNORMAL HIGH (ref 70–99)
Glucose-Capillary: 137 mg/dL — ABNORMAL HIGH (ref 70–99)
Glucose-Capillary: 146 mg/dL — ABNORMAL HIGH (ref 70–99)
Glucose-Capillary: 174 mg/dL — ABNORMAL HIGH (ref 70–99)

## 2014-10-08 LAB — TSH: TSH: 1.78 u[IU]/mL (ref 0.350–4.500)

## 2014-10-08 MED ORDER — ACETAMINOPHEN 325 MG PO TABS: 650.0000 mg | ORAL_TABLET | Freq: Four times a day (QID) | ORAL | Status: AC | PRN

## 2014-10-08 MED ORDER — ASPIRIN EC 325 MG PO TBEC: 325.0000 mg | DELAYED_RELEASE_TABLET | Freq: Every day | ORAL | Status: DC

## 2014-10-08 MED ORDER — ENSURE COMPLETE PO LIQD: 237.0000 mL | Freq: Two times a day (BID) | ORAL | Status: AC

## 2014-10-08 MED ORDER — FERROUS SULFATE 325 (65 FE) MG PO TABS: 325.0000 mg | ORAL_TABLET | Freq: Three times a day (TID) | ORAL | Status: AC

## 2014-10-08 NOTE — Plan of Care (Signed)
Problem: Phase II Progression Outcomes Goal: CMS/Neurovascular status WDL Outcome: Completed/Met Date Met:  10/08/14

## 2014-10-08 NOTE — Progress Notes (Signed)
[facility], Patient discharge packet prepared by CSW and given to EMS transporter for the facility, Patient  in no distress. Skin intact, with redness on bil heel covered with allevyn dressing.

## 2014-10-08 NOTE — Discharge Summary (Signed)
Physician Discharge Summary  Collin HELZER Sr. ATF:[id number] DOB: 11-10-1917 DOA: 10/04/2014  PCP: Cleda Mccreedy, MD  Admit date: 10/04/2014 Discharge date: 10/08/2014  Time spent: 35 minutes  Recommendations for Outpatient Follow-up:  1. Needs B-met to follow renal function.  2. Monitor CBG, use SSI as needed for Hyperglycemia.  3. Careful with sedatives.  4. Follow up with Dr Noralee Chars in 2 weeks.  5. Cbc to follow Hb.   Discharge Diagnoses:    Femur fracture, left   Encephalopathy, related to medications.    Hypoglycemia   Pacemaker-St. Jude's   Atrioventricular block, complete   AKI (acute kidney injury)   Hyponatremia   Thrombocytopenia   Acute blood loss anemia   Discharge Condition: Stable.   Diet recommendation: General, encourage patient to eat.   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 Jude's (08/14/2013). Today was brought to the ED from skilled nursing facility after patient had [name] fall.patient is [name] poor historian and unable to provide any history. In the ED the imaging studies revealed nondisplaced left femoral neck fracture as well as distal femoral metaphysis fracture. No other history is obtainable at this time. Called the son, and discussed with him. As per son patient usually able to talk, feed himself.  Patient has [name] history of complete heart block and has pacemaker in place. Review of systems not obtainable at this time due to patient's somnolence.  Hospital Course:  1-Left Femur Fracture:  Non displaced.  Per ortho conservative management.  Immobilizer LE.  Non weight bearing left leg.  PT at the facility.  On aspirin 325 mg for DVT prophylaxis for 4 weeks.    2-Encephalopathy; Resolved.  Will avoid sedatives.  Patient more alert, answering questions.   3-Diabetes, with episode of hypoglycemia;resolved.  Continue to hold lantus.  SSI as needed.  Monitor for hypoglycemia.   4-Acute on chronic renal failure:  Improving with IV fluids. Cr decrease to 1.5 Last Cr per records at 1.3.   5-Hyponatremia;improved with IV fluids.   6-Hypothyroidism; continue with synthroid.  7-Thrombocytopenia; Monitor. Chronic, platelet at 120 (3 years ago).   8-Anemia; HB stable at 9. Iron deficiency anemia. Started ferrous sulfate.   DVT prophylaxis : Lovenox.   [name]-V block; S/P pacemaker.   Procedures:  none  Consultations:  Dr Noralee Chars.   Discharge Exam: Filed Vitals:   10/08/14 1325  BP: 128/41  Pulse: 74  Temp: 98.8 F (37.1 C)  Resp: 17    General: Alert answering questions.  Cardiovascular: S 1, S 2 RRR Respiratory: CTA  Discharge Instructions You were cared for by [name] hospitalist during your hospital stay. If you have any questions about your discharge medications or the care you received while you were in the hospital after you are discharged, you can call the unit and asked to speak with the hospitalist on call if the hospitalist that took care of you is not available. Once you are discharged, your primary care physician will handle any further medical issues. Please note that NO REFILLS for any discharge medications will be authorized once you are discharged, as it is imperative that you return to your primary care physician (or establish [name] relationship with [name] primary care physician if you do not have one)  for your aftercare needs so that they can reassess your need for medications and monitor your lab values.  Discharge Instructions    Diet - low sodium heart healthy    Complete by:  As directed      Increase activity slowly    Complete by:  As directed           Current Discharge Medication List    START taking these  medications   Details  acetaminophen (TYLENOL) 325 MG tablet Take 2 tablets (650 mg total) by mouth every 6 (six) hours as needed for moderate pain. Qty: 30 tablet, Refills: 0    aspirin EC 325 MG tablet Take 1 tablet (325 mg total) by mouth daily. Qty: 30 tablet, Refills: 0    feeding supplement, ENSURE COMPLETE, (ENSURE COMPLETE) LIQD Take 237 mLs by mouth 2 (two) times daily between meals. Qty: 30 Bottle, Refills: 0    ferrous sulfate 325 (65 FE) MG tablet Take 1 tablet (325 mg total) by mouth 3 (three) times daily with meals. Qty: 60 tablet, Refills: 3      CONTINUE these medications which have NOT CHANGED   Details  cetirizine (ZYRTEC) 10 MG tablet Take 10 mg by mouth daily. For 6 weeks for itching    Eyelid Cleansers (OCUSOFT LID SCRUB) PADS Apply 1 each topically 2 (two) times daily.    ipratropium-albuterol (DUONEB) 0.5-2.5 (3) MG/3ML SOLN Take 3 mLs by nebulization every 6 (six) hours as needed.    levothyroxine (SYNTHROID, LEVOTHROID) 50 MCG tablet Take 50 mcg by mouth daily before breakfast.    multivitamin (THERAGRAN) per tablet Take 1 tablet by mouth daily.      oxycodone (OXY-IR) 5 MG capsule Take 5 mg by mouth every 6 (six) hours as needed.     polyethylene glycol (MIRALAX / GLYCOLAX) packet Take 17 g by mouth daily as needed.     ranitidine (ZANTAC) 75 MG tablet Take 75 mg by mouth at bedtime.    Vitamin D, Ergocalciferol, (DRISDOL) 50000 UNITS CAPS capsule Take 50,000 Units by mouth every 7 (seven) days. For 8 weeks.    Amino Acids-Protein Hydrolys (FEEDING SUPPLEMENT, PRO-STAT SUGAR FREE 64,) LIQD Take 30 mLs by mouth 3 (three) times daily with meals.    baci-polymyx-neo-hydrocort (CORTISPORIN) 1 % ointment Place 1 % into both eyes 4 (four) times daily.    Cholecalciferol (D-3-5) 5000 UNITS capsule Take 5,000 Units by mouth daily.    fentaNYL (DURAGESIC - DOSED MCG/HR) 25 MCG/HR patch Place 1 patch onto the skin every 3 (three) days.    guaiFENesin  (ROBITUSSIN) 100 MG/5ML liquid Take 200 mg by mouth every 4 (four) hours as needed for cough.    insulin lispro (HUMALOG) 100 UNIT/ML injection Inject into the skin as directed. Per sliding scale    mirtazapine (REMERON) 7.5 MG tablet Take 7.5 mg by mouth at bedtime.    pseudoephedrine (SUDAFED) 60 MG tablet Take 60 mg by mouth every 6 (six) hours as needed for congestion.    vitamin C (ASCORBIC ACID) 500 MG tablet Take 500 mg by mouth daily.    zolpidem (AMBIEN) 5 MG tablet Take 2.5 mg by mouth at bedtime.      STOP taking these medications     aspirin 81 MG tablet      glipiZIDE (GLUCOTROL) 2.5 mg TABS tablet      hydrochlorothiazide (MICROZIDE) 12.5 MG capsule      insulin glargine (LANTUS) 100 UNIT/ML injection  No Known Allergies Follow-up Information    Follow up with Cleda Mccreedy, MD In 1 week.   Specialty:  Internal Medicine   Contact information:   [address] [place] [address] [phone]        The results of significant diagnostics from this hospitalization (including imaging, microbiology, ancillary and laboratory) are listed below for reference.    Significant Diagnostic Studies: Dg Chest 1 View  10/04/2014   CLINICAL DATA:  Recent fall with chest pain  EXAM: CHEST - 1 VIEW  COMPARISON:  11/21/2005  FINDINGS: [name] pacing device is again seen. The cardiac shadow is within normal limits. The examination is limited secondary to the patient being unable to position himself. No focal infiltrate is seen. No definitive bony abnormality is seen.  IMPRESSION: No acute abnormality noted.   Electronically Signed   By: Inez Catalina M.D.   On: 10/04/2014 20:34   Dg Hip Bilateral W/pelvis  10/04/2014   CLINICAL DATA:  Initial encounter. Fall 2 days ago. RIGHT and LEFT hip pain.  EXAM: BILATERAL HIP WITH PELVIS - 4+ VIEW  COMPARISON:  None.  FINDINGS: RIGHT hip dynamic screw fixation is present. No complicating features. Encircling cables are present around  the [facility] and proximal femoral plate. Prominent stool is present within the rectum. Radiographs are technically suboptimal due to obliquity and contracture of the RIGHT hip.  The LEFT hip lateral views are technically suboptimal. No displaced fracture is identified. On the lateral view, the lateral margin of the cortex is difficult to visualize but does appear contiguous.  Atherosclerosis is present.  IMPRESSION: No acute osseous abnormality. Contracture of the LEFT hip. Old ORIF of the RIGHT hip.   Electronically Signed   By: Dereck Ligas M.D.   On: 10/04/2014 18:55   Dg Femur Left  10/04/2014   CLINICAL DATA:  Recent fall with left femur pain  EXAM: LEFT FEMUR - 2 VIEW  COMPARISON:  None.  FINDINGS: Generalized osteopenia is noted. The known distal femoral fractures are not well appreciated on these images but better seen on the recent knee image. There is some suggestion of [name] subcapital neck fracture.  IMPRESSION: Suspicion for [name] subcapital left femoral neck fracture. CT may be helpful for further evaluation.   Electronically Signed   By: Inez Catalina M.D.   On: 10/04/2014 20:37   Dg Femur Right  10/04/2014   CLINICAL DATA:  Recent fall with pain  EXAM: RIGHT FEMUR - 2 VIEW  COMPARISON:  None.  FINDINGS: Postsurgical changes are noted in the femur with fixation sideplate and multiple fixation screws. The previously seen fractures have is and healed. No new fractures are seen. Generalized osteopenia is noted.  IMPRESSION: Chronic changes without acute abnormality.   Electronically Signed   By: Inez Catalina M.D.   On: 10/04/2014 20:38   Ct Head Wo Contrast  10/04/2014   CLINICAL DATA:  Confusion and pain post trauma 2 days prior  EXAM: CT HEAD WITHOUT CONTRAST  CT CERVICAL SPINE WITHOUT CONTRAST  TECHNIQUE: Multidetector CT imaging of the head and cervical spine was performed following the standard protocol without intravenous contrast. Multiplanar CT image reconstructions of the cervical spine were also  generated.  COMPARISON:  None.  FINDINGS: CT HEAD FINDINGS  There is moderate diffuse atrophy. There is no intracranial mass, hemorrhage, extra-axial fluid collection, or midline shift. There is mild patchy small vessel disease in the centra semiovale bilaterally, somewhat more on the right than on the left.  There is no acute appearing infarct. Bony calvarium appears intact. Mastoid air cells on the left are clear. There is diffuse opacification of hypoplastic mastoids on the right. There is debris in each external auditory canal. There is diffuse opacification in the right middle ear region without bony destruction. There is mucosal thickening in the right maxillary antrum as well as in the ethmoid air cells bilaterally. There is extensive opacification of the right sphenoid sinus.  CT CERVICAL SPINE FINDINGS  There is marked scoliosis. Bones are osteoporotic. There is no demonstrable fracture. There is 5 mm of anterolisthesis of C3 on C4. There is no other appreciable spondylolisthesis. Prevertebral soft tissues and predental space regions are normal. There is moderate disc space narrowing at C3-4, C5-6, and C7-T1. There is multifocal osteoarthritic change. There is no frank disc extrusion or stenosis. Bones are osteoporotic. There is fibrosis in both apical regions.  IMPRESSION: CT head: Atrophy with periventricular small vessel disease. No intracranial mass, hemorrhage, or extra-axial fluid. There is diffuse opacification of hypoplastic mastoids on the right. There is multifocal paranasal sinus disease. There is apparent cerumen in each external auditory canal. There is diffuse opacification in the right middle ear region without appreciable bony destruction.  CT cervical spine: No demonstrable fracture. Spondylolisthesis at C3-4 is felt to be due to underlying spondylosis. There is multifocal osteoarthritic change. There is scoliosis. Bones are osteoporotic.   Electronically Signed   By: Lowella Grip M.D.    On: 10/04/2014 19:13   Ct Cervical Spine Wo Contrast  10/04/2014   CLINICAL DATA:  Confusion and pain post trauma 2 days prior  EXAM: CT HEAD WITHOUT CONTRAST  CT CERVICAL SPINE WITHOUT CONTRAST  TECHNIQUE: Multidetector CT imaging of the head and cervical spine was performed following the standard protocol without intravenous contrast. Multiplanar CT image reconstructions of the cervical spine were also generated.  COMPARISON:  None.  FINDINGS: CT HEAD FINDINGS  There is moderate diffuse atrophy. There is no intracranial mass, hemorrhage, extra-axial fluid collection, or midline shift. There is mild patchy small vessel disease in the centra semiovale bilaterally, somewhat more on the right than on the left. There is no acute appearing infarct. Bony calvarium appears intact. Mastoid air cells on the left are clear. There is diffuse opacification of hypoplastic mastoids on the right. There is debris in each external auditory canal. There is diffuse opacification in the right middle ear region without bony destruction. There is mucosal thickening in the right maxillary antrum as well as in the ethmoid air cells bilaterally. There is extensive opacification of the right sphenoid sinus.  CT CERVICAL SPINE FINDINGS  There is marked scoliosis. Bones are osteoporotic. There is no demonstrable fracture. There is 5 mm of anterolisthesis of C3 on C4. There is no other appreciable spondylolisthesis. Prevertebral soft tissues and predental space regions are normal. There is moderate disc space narrowing at C3-4, C5-6, and C7-T1. There is multifocal osteoarthritic change. There is no frank disc extrusion or stenosis. Bones are osteoporotic. There is fibrosis in both apical regions.  IMPRESSION: CT head: Atrophy with periventricular small vessel disease. No intracranial mass, hemorrhage, or extra-axial fluid. There is diffuse opacification of hypoplastic mastoids on the right. There is multifocal paranasal sinus disease.  There is apparent cerumen in each external auditory canal. There is diffuse opacification in the right middle ear region without appreciable bony destruction.  CT cervical spine: No demonstrable fracture. Spondylolisthesis at C3-4 is felt to be due to underlying spondylosis. There is multifocal osteoarthritic  change. There is scoliosis. Bones are osteoporotic.   Electronically Signed   By: Lowella Grip M.D.   On: 10/04/2014 19:13   Ct Femur Left Wo Contrast  10/04/2014   CLINICAL DATA:  Evaluation of fracture seen on previous plain x-ray. Previous plain films demonstrated suspicion of subcapital left femoral neck fracture.  EXAM: CT OF THE LEFT FEMUR WITHOUT CONTRAST  TECHNIQUE: Multidetector CT imaging was performed according to the standard protocol. Multiplanar CT image reconstructions were also generated.  COMPARISON:  Plain radiographs 10/04/2014  FINDINGS: Examination is technically limited due to difficulty with patient positioning, motion and uncooperative patient.  Diffuse bone demineralization. There is sclerosis in the left femoral neck with cortical irregularity consistent with nondisplaced fracture. No dislocation of the hip. Linear lucency with cortical step-off in the distal left femoral metaphysis consistent with nondisplaced fracture here is well. Visualized pelvis appears intact. Postoperative changes with fixation hardware in the right hip.  IMPRESSION: Nondisplaced fractures demonstrated in the left femoral neck and in the distal left femoral metaphysis.   Electronically Signed   By: Lucienne Capers M.D.   On: 10/04/2014 23:23   Dg Knee Complete 4 Views Left  10/04/2014   CLINICAL DATA:  Fall 2 days ago.  Left knee pain.  EXAM: LEFT KNEE - COMPLETE 4+ VIEW  COMPARISON:  None.  FINDINGS: There is extensive osteopenia. There are extensive vascular calcifications. There is concern for [name] cortical step-off involving the lateral distal femur at the junction of the diaphysis and metaphysis.  Fracture is not confidently identified on the cross-table lateral views. Tibial plateau and proximal fibula appear to be intact. There does not appear to be [name] large joint effusion.  IMPRESSION: Concern for [name] mildly displaced fracture involving the distal femur as described. This finding is not confirmed on all views and could be further evaluated with cross-sectional imaging. Recommend clinical correlation in this area.   Electronically Signed   By: Markus Daft M.D.   On: 10/04/2014 18:57    Microbiology: Recent Results (from the past 240 hour(s))  MRSA PCR Screening     Status: None   Collection Time: 10/05/14  2:12 AM  Result Value Ref Range Status   MRSA by PCR NEGATIVE NEGATIVE Final    Comment:        The GeneXpert MRSA Assay (FDA approved for NASAL specimens only), is one component of [name] comprehensive MRSA colonization surveillance program. It is not intended to diagnose MRSA infection nor to guide or monitor treatment for MRSA infections.      Labs: Basic Metabolic Panel:  Recent Labs Lab 10/04/14 1900 10/05/14 0400 10/06/14 0410 10/07/14 0335  NA 133* 137 142 147  K 4.3 4.1 3.8 3.8  CL 98 101 109 113*  CO2 _0 GLUCOSE 157* 165* 89 100*  BUN 48* 49* 44* 37*  CREATININE 1.68* 1.70* 1.53* 1.56*  CALCIUM 8.4 8.4 8.3* 8.1*   Liver Function Tests:  Recent Labs Lab 10/04/14 1900  AST 15  ALT 15  ALKPHOS 112  BILITOT 0.7  PROT 6.9  ALBUMIN 2.8*   No results for input(s): LIPASE, AMYLASE in the last 168 hours. No results for input(s): AMMONIA in the last 168 hours. CBC:  Recent Labs Lab 10/04/14 1900 10/05/14 0400 10/06/14 0410 10/07/14 0335  WBC 9.9 8.5 6.2 5.9  NEUTROABS 7.7  --   --   --   HGB 10.4* 9.5* 9.1* 8.7*  HCT 30.2* 28.1* 26.6* 26.2*  MCV 95.9  95.6 98.2 98.9  PLT 125* 115* 107* 100*   Cardiac Enzymes: No results for input(s): CKTOTAL, CKMB, CKMBINDEX, TROPONINI in the last 168 hours. BNP: BNP (last 3 results) No results for  input(s): PROBNP in the last 8760 hours. CBG:  Recent Labs Lab 10/07/14 2054 10/08/14 0036 10/08/14 0510 10/08/14 0807 10/08/14 1226  GLUCAP 120* 129* 136* 137* 174*       Signed:  Hriday Stai [name]  [facility] 10/08/2014, 1:43 PM

## 2014-10-08 NOTE — Progress Notes (Signed)
     Subjective: Nondisplaced fractures demonstrated in the left femoral neck and in the distal left femoral metaphysis.  Patient reports pain as mild, pain appears to be controled. States that he doesn't have any pain in the left hip or knee.  He has a little back pain however.  He is curled up as much as he can be, lying on his right side.  Objective:   VITALS:   Filed Vitals:   10/08/14  BP: 131/54  Pulse: 67  Temp: 99.4 F (37.4 C)   Resp: 18    No cellulitis present Compartment soft  LABS  Recent Labs  10/06/14 0410 10/07/14 0335  HGB 9.1* 8.7*  HCT 26.6* 26.2*  WBC 6.2 5.9  PLT 107* 100*     Recent Labs  10/06/14 0410 10/07/14 0335  NA 142 147  K 3.8 3.8  BUN 44* 37*  CREATININE 1.53* 1.56*  GLUCOSE 89 100*     Assessment/Plan: Nondisplaced fractures demonstrated in the left femoral neck and in the distal left femoral metaphysis.  Given these findings and discussion Dr. Charlann Boxerlin has concluded that he'd like to try and treat this without an operation Knee immobilizer on the left LE to be keep it immobile  NON weight bearing left lower extremity until fracture healing evident Would anticipate fracture healing provided no further set backs PT can assess needs SW to arrange plans for return to SNF with above recs and follow up in office    Anastasio AuerbachMatthew S. Nekesha Font   PAC  10/08/2014, 9:04 AM

## 2014-10-08 NOTE — Progress Notes (Signed)
[facility] is [facility]. Patient & son, Fredrik CoveRoger aware. Discharge packet given to RN [facility] aware. [facility] called for transport.    Lincoln MaxinKelly Skarlett Sedlacek, LCSW [facility] Clinical Social Worker cell #: [phone]

## 2014-10-08 NOTE — Plan of Care (Signed)
Problem: Phase II Progression Outcomes Goal: Tolerating diet Outcome: Progressing     

## 2014-10-16 ENCOUNTER — Ambulatory Visit (INDEPENDENT_AMBULATORY_CARE_PROVIDER_SITE_OTHER): Payer: PRIVATE HEALTH INSURANCE | Admitting: *Deleted

## 2014-10-16 DIAGNOSIS — I442 Atrioventricular block, complete: Secondary | ICD-10-CM

## 2014-10-16 NOTE — Progress Notes (Signed)
Pacemaker check in clinic. Battery longevity 2.50 to 3.50 years. Normal device function. No changes made. ROV in March with SK.

## 2014-11-10 ENCOUNTER — Encounter: Payer: Self-pay | Admitting: Internal Medicine

## 2014-12-16 ENCOUNTER — Ambulatory Visit: Payer: PRIVATE HEALTH INSURANCE | Admitting: Cardiology

## 2015-01-05 ENCOUNTER — Ambulatory Visit (INDEPENDENT_AMBULATORY_CARE_PROVIDER_SITE_OTHER): Payer: Medicare Other | Admitting: Cardiology

## 2015-01-05 ENCOUNTER — Encounter: Payer: Self-pay | Admitting: Cardiology

## 2015-01-05 VITALS — BP 135/69 | HR 66 | Ht 70.0 in | Wt 166.0 lb

## 2015-01-05 DIAGNOSIS — I442 Atrioventricular block, complete: Secondary | ICD-10-CM

## 2015-01-05 DIAGNOSIS — Z950 Presence of cardiac pacemaker: Secondary | ICD-10-CM | POA: Diagnosis not present

## 2015-01-05 DIAGNOSIS — I10 Essential (primary) hypertension: Secondary | ICD-10-CM

## 2015-01-05 NOTE — Patient Instructions (Signed)
Your physician recommends that you schedule a follow-up appointment in: as needed with Dr. Wyline MoodBranch  Your physician recommends that you continue on your current medications as directed. Please refer to the Current Medication list given to you today.  Thank you for choosing [facility]!!

## 2015-01-05 NOTE — Progress Notes (Signed)
Clinical Summary Collin [name] is a 81 y.o.male seen today for follow up of the following medical problems.   1. Heart block - prior St Jude dual chamber pacemaker placement in 2006 for sinus node dysfunction. Device check 10/2014 normal function - denies any lightheadedness or dizziness. No syncope or presyncope   2. HTN - no longer requiring medical therapy  3. Hip fracture - recent admission with hip fracture, managed by ortho   Past Medical History  Diagnosis Date  . Dysphagia 04/13/2011  . GERD (gastroesophageal reflux disease) 04/13/2011  . 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 Atrioventricular block, complete 08/14/2013  . Pacemaker-St. Jude's 08/14/2013     No Known Allergies   Current Outpatient Prescriptions  Medication Sig Dispense Refill  . acetaminophen (TYLENOL) 325 MG tablet Take 2 tablets (650 mg total) by mouth every 6 (six) hours as needed for moderate pain. 30 tablet 0  . Amino Acids-Protein Hydrolys (FEEDING SUPPLEMENT, PRO-STAT SUGAR FREE 64,) LIQD Take 30 mLs by mouth 3 (three) times daily with meals.    Marland Kitchen aspirin EC 325 MG tablet Take 1 tablet (325 mg total) by mouth daily. 30 tablet 0  . baci-polymyx-neo-hydrocort (CORTISPORIN) 1 % ointment Place 1 % into both eyes 4 (four) times daily.    . cetirizine (ZYRTEC) 10 MG tablet Take 10 mg by mouth daily. For 6 weeks for itching    . Cholecalciferol (D-3-5) 5000 UNITS capsule Take 5,000 Units by mouth daily.    . Eyelid Cleansers (OCUSOFT LID SCRUB) PADS Apply 1 each topically 2 (two) times daily.    . feeding supplement, ENSURE COMPLETE, (ENSURE COMPLETE) LIQD Take 237 mLs by mouth 2 (two) times daily between meals. 30 Bottle 0  . fentaNYL (DURAGESIC - DOSED MCG/HR) 25 MCG/HR patch Place 1 patch onto the skin every 3 (three) days.    . ferrous sulfate 325 (65 FE) MG tablet Take 1 tablet (325 mg total) by mouth 3 (three) times daily with meals. 60 tablet 3  . guaiFENesin (ROBITUSSIN) 100 MG/5ML  liquid Take 200 mg by mouth every 4 (four) hours as needed for cough.    . insulin lispro (HUMALOG) 100 UNIT/ML injection Inject into the skin as directed. Per sliding scale    . ipratropium-albuterol (DUONEB) 0.5-2.5 (3) MG/3ML SOLN Take 3 mLs by nebulization every 6 (six) hours as needed.    Marland Kitchen levothyroxine (SYNTHROID, LEVOTHROID) 50 MCG tablet Take 50 mcg by mouth daily before breakfast.    . mirtazapine (REMERON) 7.5 MG tablet Take 7.5 mg by mouth at bedtime.    . multivitamin (THERAGRAN) per tablet Take 1 tablet by mouth daily.      Marland Kitchen oxycodone (OXY-IR) 5 MG capsule Take 5 mg by mouth every 6 (six) hours as needed.     . polyethylene glycol (MIRALAX / GLYCOLAX) packet Take 17 g by mouth daily as needed.     . pseudoephedrine (SUDAFED) 60 MG tablet Take 60 mg by mouth every 6 (six) hours as needed for congestion.    . ranitidine (ZANTAC) 75 MG tablet Take 75 mg by mouth at bedtime.    . vitamin C (ASCORBIC ACID) 500 MG tablet Take 500 mg by mouth daily.    . Vitamin D, Ergocalciferol, (DRISDOL) 50000 UNITS CAPS capsule Take 50,000 Units by mouth every 7 (seven) days. For 8 weeks.    Marland Kitchen zolpidem (AMBIEN) 5 MG tablet Take 2.5 mg by mouth at bedtime.     No current  facility-administered medications for this visit.     Past Surgical History  Procedure Laterality Date  . Esophagogastroduodenoscopy  05/19/2011     No Known Allergies    Family History  Problem Relation Age of Onset  . Kidney failure    . Anemia    . Thrombocytopenia    . Dysphagia    . Diabetes type II    . Obesity    . Constipation    . Muscular dystrophy    . Hypertension    . Dysphagia       Social History Collin [name] reports that he has never smoked. He has never used smokeless tobacco. Collin [name] reports that he does not drink alcohol.   Review of Systems CONSTITUTIONAL: No weight loss, fever, chills, weakness or fatigue.  HEENT: Eyes: No visual loss, blurred vision, double vision or yellow  sclerae.No hearing loss, sneezing, congestion, runny nose or sore throat.  SKIN: No rash or itching.  CARDIOVASCULAR: per HPI RESPIRATORY: No shortness of breath, cough or sputum.  GASTROINTESTINAL: No anorexia, nausea, vomiting or diarrhea. No abdominal pain or blood.  GENITOURINARY: No burning on urination, no polyuria NEUROLOGICAL: No headache, dizziness, syncope, paralysis, ataxia, numbness or tingling in the extremities. No change in bowel or bladder control.  MUSCULOSKELETAL: No muscle, back pain, joint pain or stiffness.  LYMPHATICS: No enlarged nodes. No history of splenectomy.  PSYCHIATRIC: No history of depression or anxiety.  ENDOCRINOLOGIC: No reports of sweating, cold or heat intolerance. No polyuria or polydipsia.  Marland Kitchen.   Physical Examination p 66 bp 135/69 Wt 166 lbs BMI 24 Gen: resting comfortably, no acute distress HEENT: no scleral icterus, pupils equal round and reactive, no palptable cervical adenopathy,  CV: RRR, no m/r/g, no JVD Resp: Clear to auscultation bilaterally GI: abdomen is soft, non-tender, non-distended, normal bowel sounds, no hepatosplenomegaly MSK: extremities are warm, no edema.  Skin: warm, no rash Neuro:  no focal deficits Psych: appropriate affect     Assessment and Plan  1. Heart block - prior St Jude dual chamber pacemaker placement in 2006 for sinus node dysfunction. Normal device check 10/2014 - no current symptoms, continue to follow.    2. HTN - at goal, no longer requiring medications   Mr Collin [name] currently does not have general cardiology needs at this time, will defer following of his pacemaker to Dr Johney FrameAllred and have him see me only as needed.       Antoine PocheJonathan F. Monea Pesantez, M.D.

## 2015-02-03 ENCOUNTER — Encounter (HOSPITAL_COMMUNITY)
Admission: RE | Admit: 2015-02-03 | Discharge: 2015-02-03 | Disposition: A | Discharge status: Home / Self Care | Payer: Medicaid Other | Source: Ambulatory Visit | Attending: Internal Medicine | Admitting: Internal Medicine

## 2015-02-06 ENCOUNTER — Encounter (HOSPITAL_COMMUNITY)
Admission: RE | Admit: 2015-02-06 | Discharge: 2015-02-06 | Disposition: A | Discharge status: Home / Self Care | Payer: Medicaid Other | Source: Ambulatory Visit | Attending: Internal Medicine | Admitting: Internal Medicine

## 2015-02-24 ENCOUNTER — Encounter: Payer: Self-pay | Admitting: Internal Medicine

## 2015-02-24 ENCOUNTER — Ambulatory Visit (INDEPENDENT_AMBULATORY_CARE_PROVIDER_SITE_OTHER): Payer: Medicare Other | Admitting: Internal Medicine

## 2015-02-24 VITALS — BP 110/60 | HR 89 | Ht 70.0 in

## 2015-02-24 DIAGNOSIS — Z45018 Encounter for adjustment and management of other part of cardiac pacemaker: Secondary | ICD-10-CM | POA: Diagnosis not present

## 2015-02-24 DIAGNOSIS — I442 Atrioventricular block, complete: Secondary | ICD-10-CM | POA: Diagnosis not present

## 2015-02-24 DIAGNOSIS — Z950 Presence of cardiac pacemaker: Secondary | ICD-10-CM | POA: Diagnosis not present

## 2015-02-24 LAB — MDC_IDC_ENUM_SESS_TYPE_INCLINIC
Battery Voltage: 2.75 V
Date Time Interrogation Session: 20160323155503
Lead Channel Impedance Value: 308 Ohm
Lead Channel Impedance Value: 349 Ohm
Lead Channel Pacing Threshold Amplitude: 0.625 V
Lead Channel Pacing Threshold Pulse Width: 0.4 ms
Lead Channel Setting Sensing Sensitivity: 2 mV
MDC IDC MSMT BATTERY IMPEDANCE: 3200 Ohm
MDC IDC MSMT LEADCHNL RA PACING THRESHOLD AMPLITUDE: 0.75 V
MDC IDC MSMT LEADCHNL RA SENSING INTR AMPL: 0.75 mV
MDC IDC MSMT LEADCHNL RV PACING THRESHOLD PULSEWIDTH: 0.4 ms
MDC IDC PG SERIAL: 1578513
MDC IDC SET LEADCHNL RA PACING AMPLITUDE: 2 V
MDC IDC SET LEADCHNL RV PACING PULSEWIDTH: 0.4 ms
MDC IDC STAT BRADY RA PERCENT PACED: 36 %
MDC IDC STAT BRADY RV PERCENT PACED: 96 %

## 2015-02-24 NOTE — Progress Notes (Signed)
Patient Care Team: Colon BranchAyyaz Qureshi, MD as PCP - General (Internal Medicine)   HPI  Collin Saucieraymond R Erlich Sr. is a 81 y.o. male Seen in pacemaker followup for device implanted 2006 for sinus node dysfunction and heart block  He is a big [employer] fan and without notable symptoms     Past Medical History  Diagnosis Date  . Dysphagia 04/13/2011  . GERD (gastroesophageal reflux disease) 04/13/2011  . 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 Atrioventricular block, complete 08/14/2013  . Pacemaker-St. Jude's 08/14/2013    Past Surgical History  Procedure Laterality Date  . Esophagogastroduodenoscopy  05/19/2011    Current Outpatient Prescriptions  Medication Sig Dispense Refill  . acetaminophen (TYLENOL) 325 MG tablet Take 2 tablets (650 mg total) by mouth every 6 (six) hours as needed for moderate pain. 30 tablet 0  . Amino Acids-Protein Hydrolys (FEEDING SUPPLEMENT, PRO-STAT SUGAR FREE 64,) LIQD Take 30 mLs by mouth 3 (three) times daily with meals.    Marland Kitchen. aspirin 81 MG tablet Take 81 mg by mouth daily.    . benzocaine (ORAJEL) 10 % mucosal gel Use as directed 1 application in the mouth or throat as needed for mouth pain.    . diphenhydrAMINE (BENADRYL) 25 MG tablet Take 12.5 mg by mouth every 6 (six) hours as needed for itching.    . Eyelid Cleansers (OCUSOFT LID SCRUB) PADS Apply 1 each topically 2 (two) times daily.    . feeding supplement, ENSURE COMPLETE, (ENSURE COMPLETE) LIQD Take 237 mLs by mouth 2 (two) times daily between meals. 30 Bottle 0  . ferrous sulfate 325 (65 FE) MG tablet Take 1 tablet (325 mg total) by mouth 3 (three) times daily with meals. 60 tablet 3  . insulin glargine (LANTUS) 100 UNIT/ML injection Inject 14 Units into the skin every morning.    Marland Kitchen. ipratropium-albuterol (DUONEB) 0.5-2.5 (3) MG/3ML SOLN Take 3 mLs by nebulization every 6 (six) hours as needed.    Marland Kitchen. levothyroxine (SYNTHROID, LEVOTHROID) 50 MCG tablet Take 50 mcg by mouth daily before  breakfast.    . mirtazapine (REMERON) 7.5 MG tablet Take 7.5 mg by mouth at bedtime.    . multivitamin (THERAGRAN) per tablet Take 1 tablet by mouth daily.      Marland Kitchen. oxycodone (OXY-IR) 5 MG capsule Take 5 mg by mouth every 4 (four) hours as needed for pain.     . polyethylene glycol (MIRALAX / GLYCOLAX) packet Take 17 g by mouth daily as needed.     . ranitidine (ZANTAC) 75 MG tablet Take 75 mg by mouth at bedtime.    . vitamin C (ASCORBIC ACID) 500 MG tablet Take 500 mg by mouth daily.    . Vitamin D, Ergocalciferol, (DRISDOL) 50000 UNITS CAPS capsule Take 50,000 Units by mouth every 7 (seven) days. For 8 weeks.     No current facility-administered medications for this visit.    No Known Allergies  Review of Systems negative except from HPI and PMH  Physical Exam BP 110/60 mmHg  Pulse 89  Ht 5\' 10"  (1.778 m) Well developed and cachectic in wheel chair HENT normal Neck supple  Few ronchi  Regular rate and rhythm,  Abd-soft with active BS No Clubbing cyanosis edema  Left leg in cast Skin-warm and dry A & Oriented  Grossly normal sensory and motor function     Assessment and  Plan  Complete heart block  Pacemaker St Jude  The patient's device was interrogated.  The information was reviewed. No changes were made in the programming.      Will arrange followup in [place] as he is in [place]

## 2015-02-24 NOTE — Patient Instructions (Addendum)
Your physician recommends that you continue on your current medications as directed. Please refer to the Current Medication list given to you today.  Your physician wants you to follow-up in: 6 months with device clinic in [place].  You will receive a reminder letter in the mail two months in advance. If you don't receive a letter, please call our office to schedule the follow-up appointment.  Your physician wants you to follow-up in: 1 year with Dr. Johney FrameAllred in [place]. You will receive a reminder letter in the mail two months in advance. If you don't receive a letter, please call our office to schedule the follow-up appointment.

## 2015-03-04 ENCOUNTER — Encounter: Payer: Self-pay | Admitting: Internal Medicine

## 2015-04-04 DEATH — deceased

## 2015-10-13 IMAGING — CR DG FEMUR 2+V*R*
4 series · 4 of 4 positions shown · non-contrast
Comparison: None.

CLINICAL DATA: Recent fall with pain

EXAM:
RIGHT FEMUR - 2 VIEW

[x hip lat right]
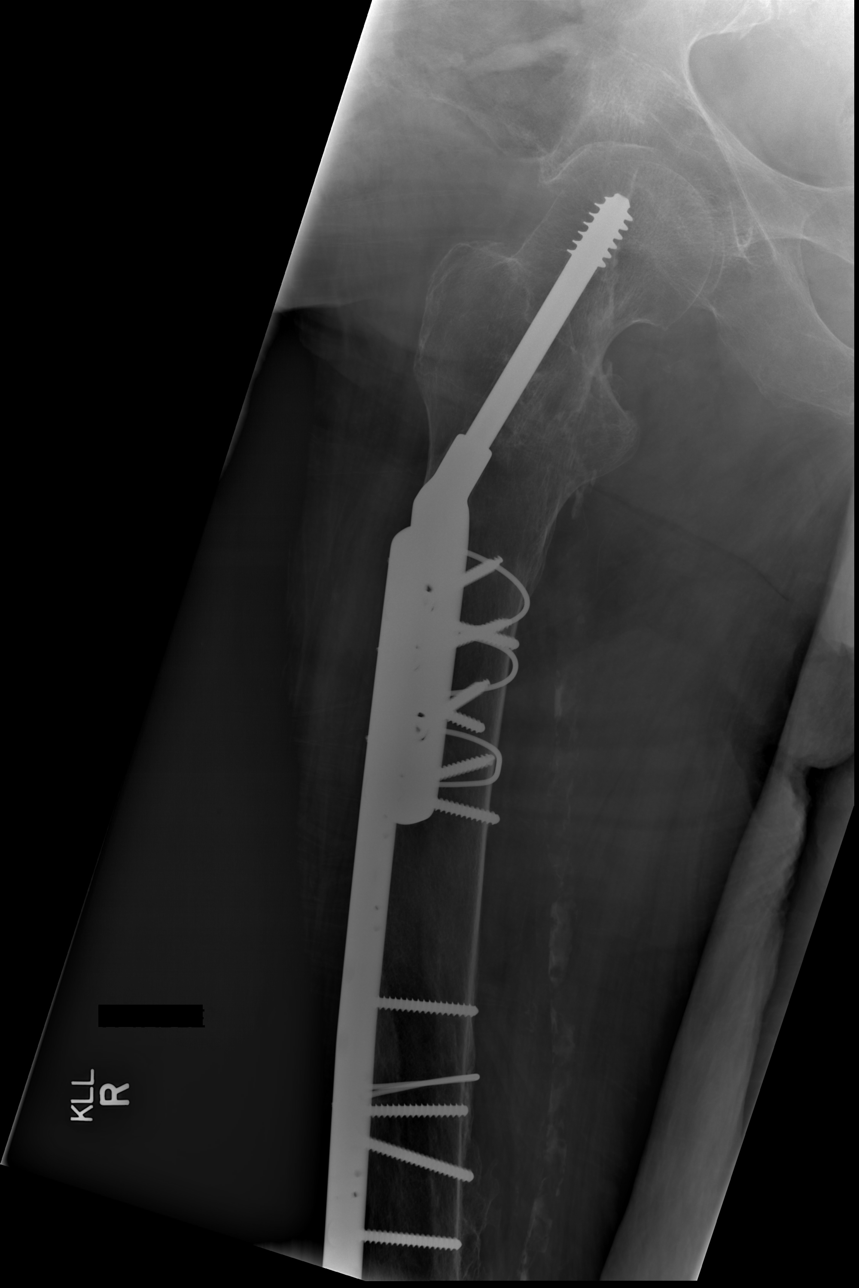

[x femur distal lat right]
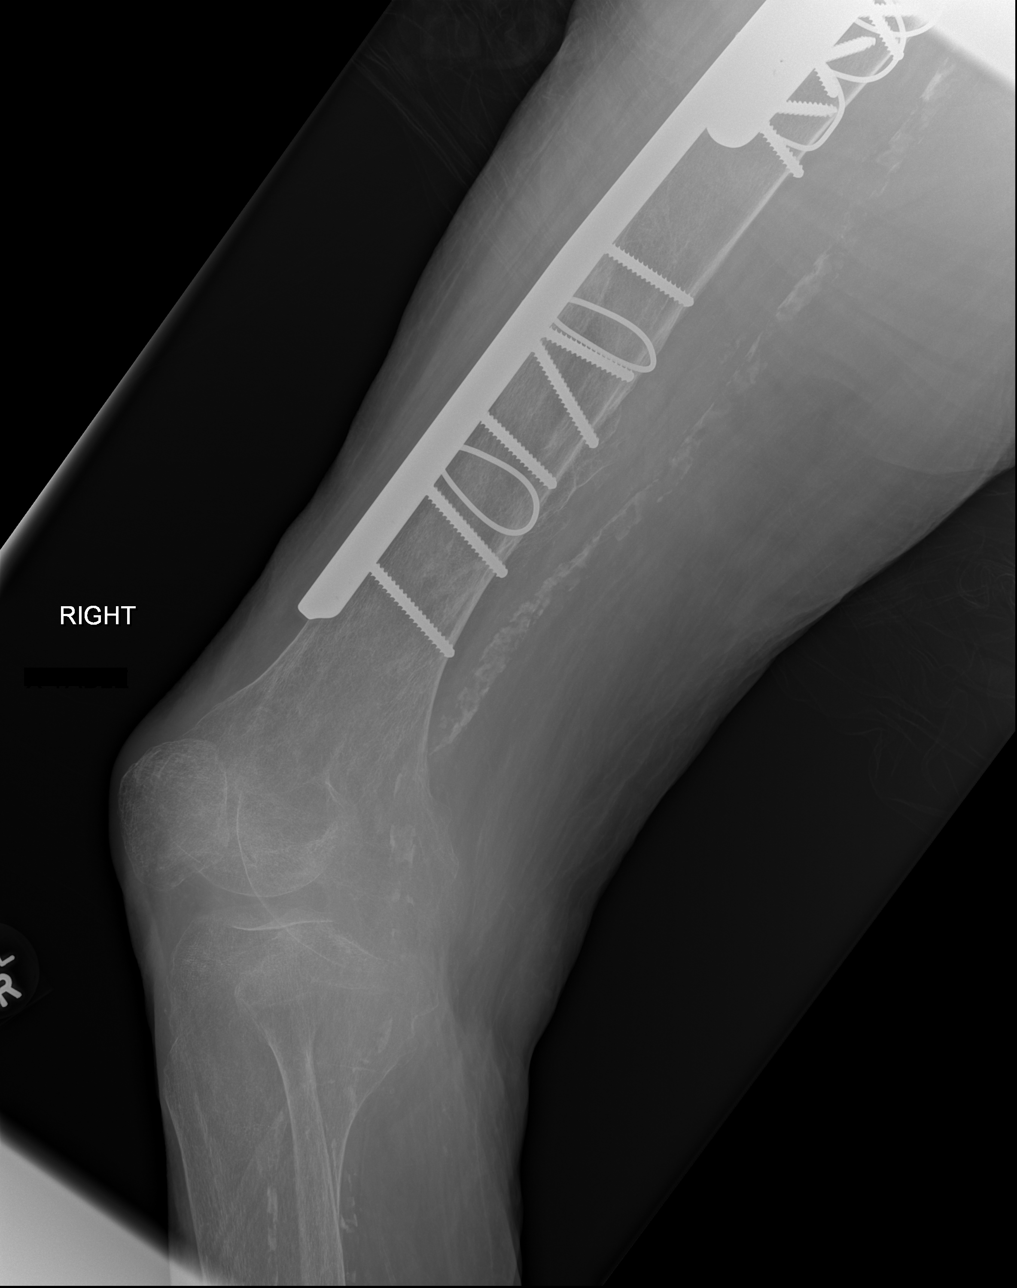

[x femur proximal ap right (1 of 2)]
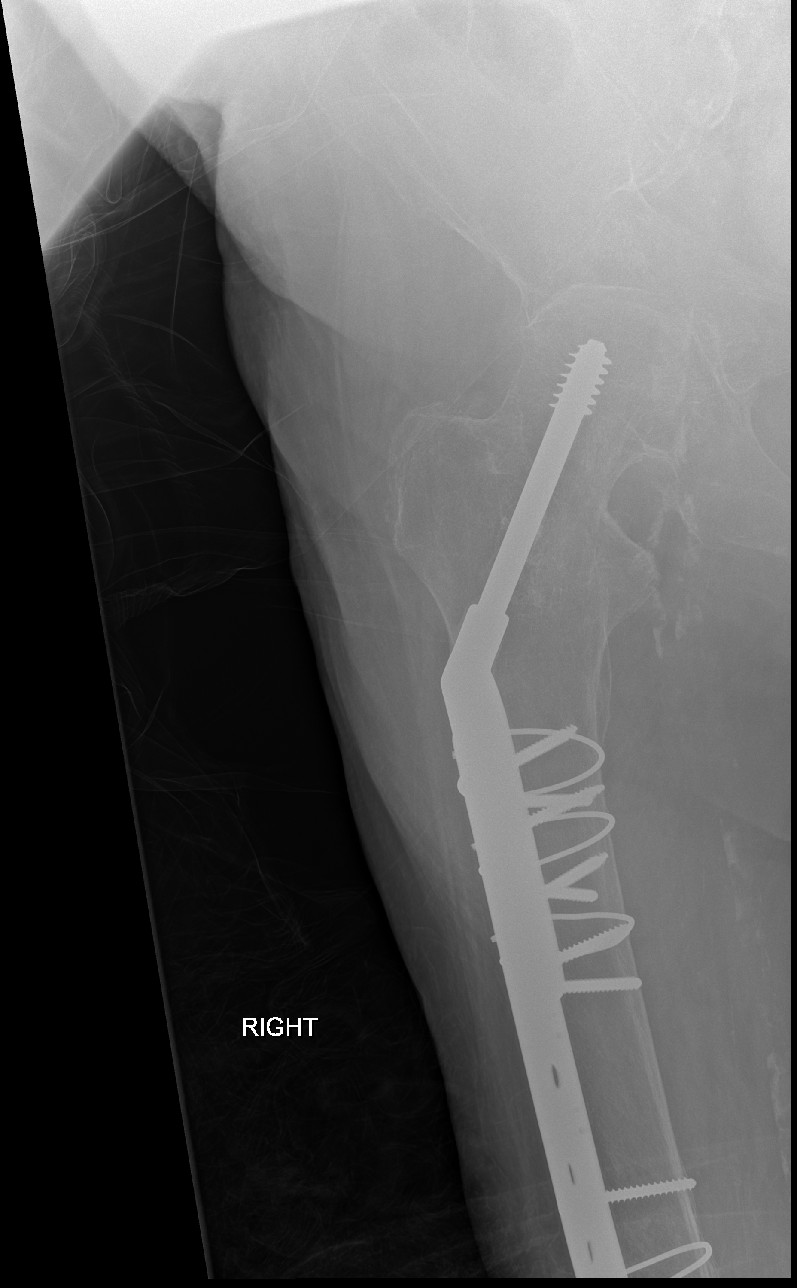

[x femur proximal ap right (2 of 2)]
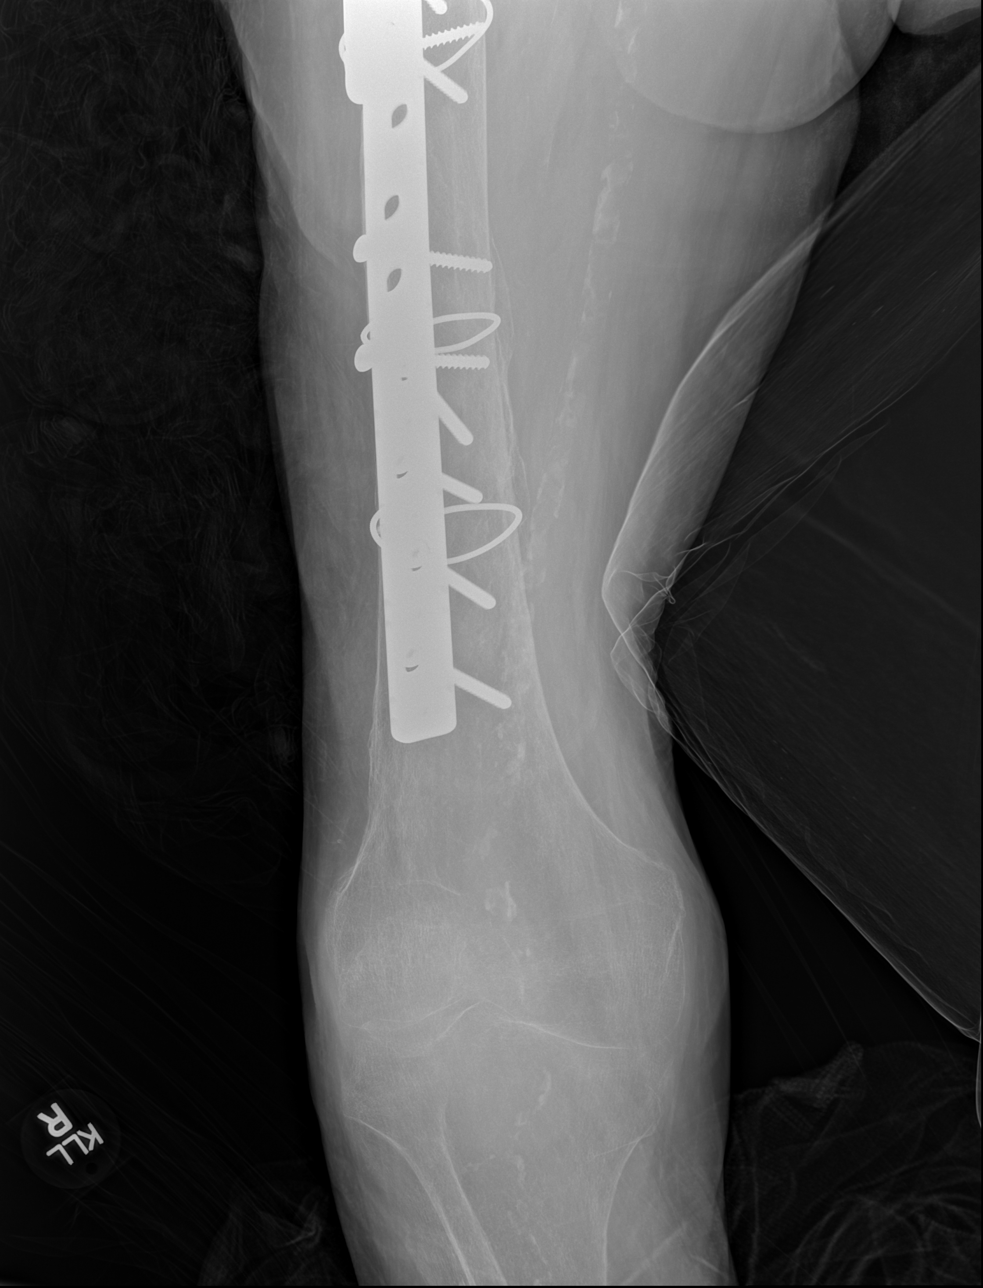

[4 of 4 positions shown; findings below may reference images not displayed]

FINDINGS: Postsurgical changes are noted in the femur with fixation sideplate
and multiple fixation screws. The previously seen fractures have is
and healed. No new fractures are seen. Generalized osteopenia is
noted.
IMPRESSION: Chronic changes without acute abnormality.

## 2015-10-13 IMAGING — CR DG HIP W/ PELVIS BILAT
5 series · 5 of 5 positions shown · non-contrast
Comparison: None.

CLINICAL DATA: Initial encounter. Fall 2 days ago. RIGHT and LEFT
hip pain.

EXAM:
BILATERAL HIP WITH PELVIS - 4+ VIEW

[t pelvis ap]
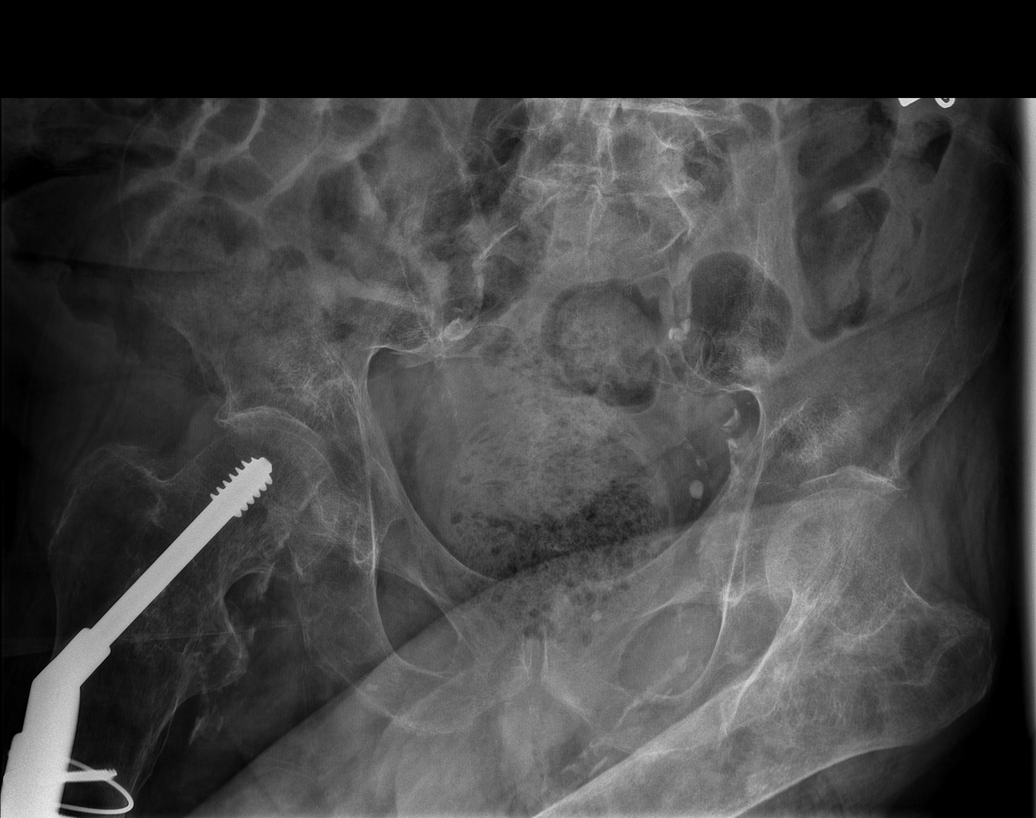

[t hip ap right]
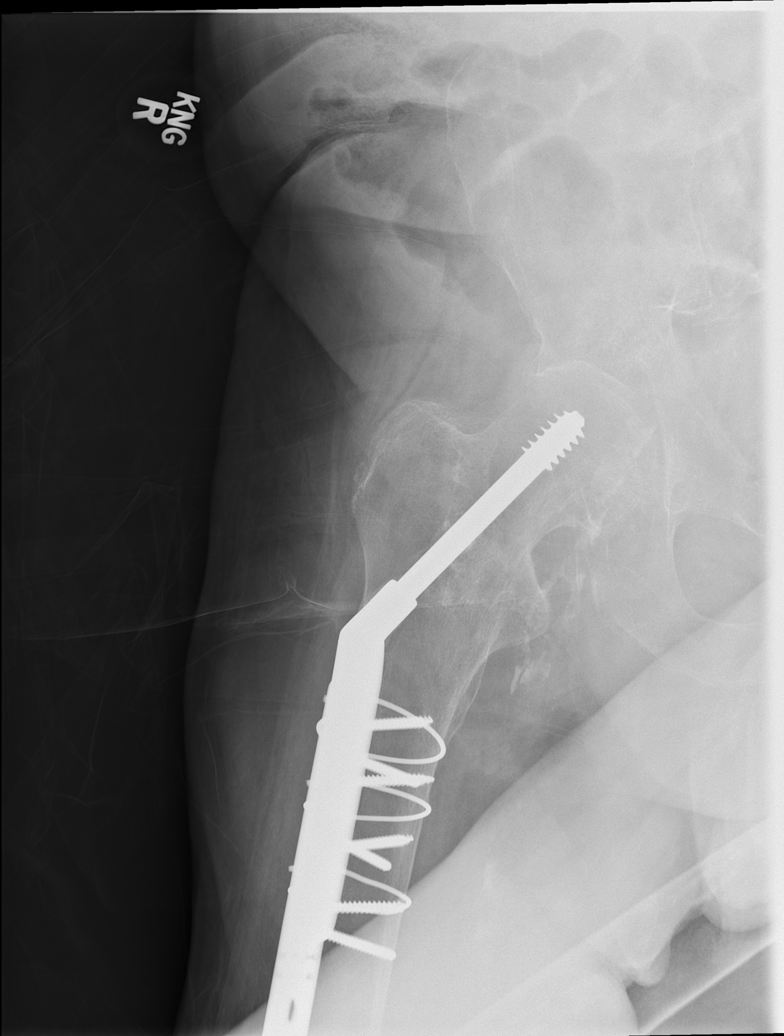

[t hip frog leg right]
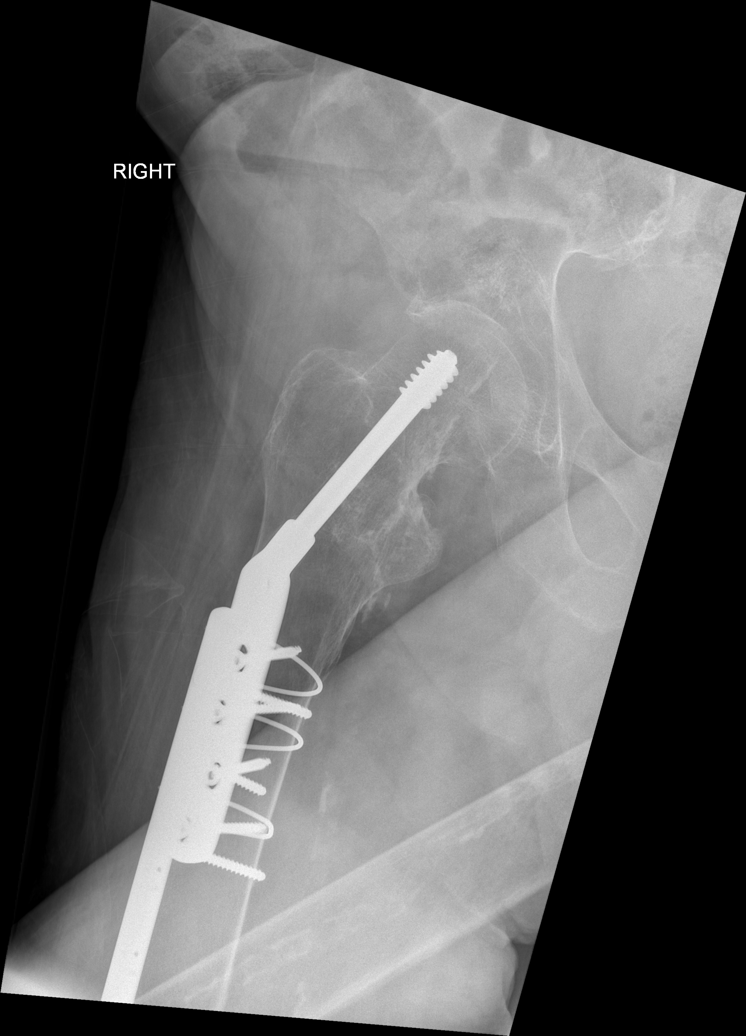

[t hip ap left]
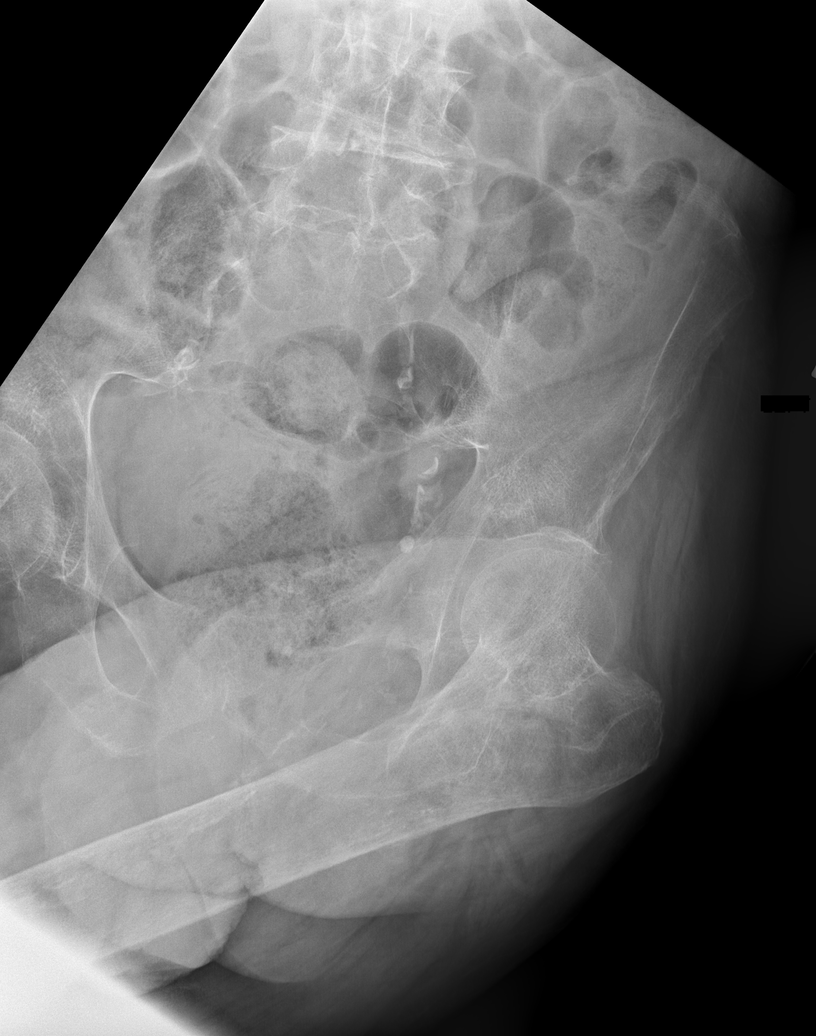

[w hip lat left]
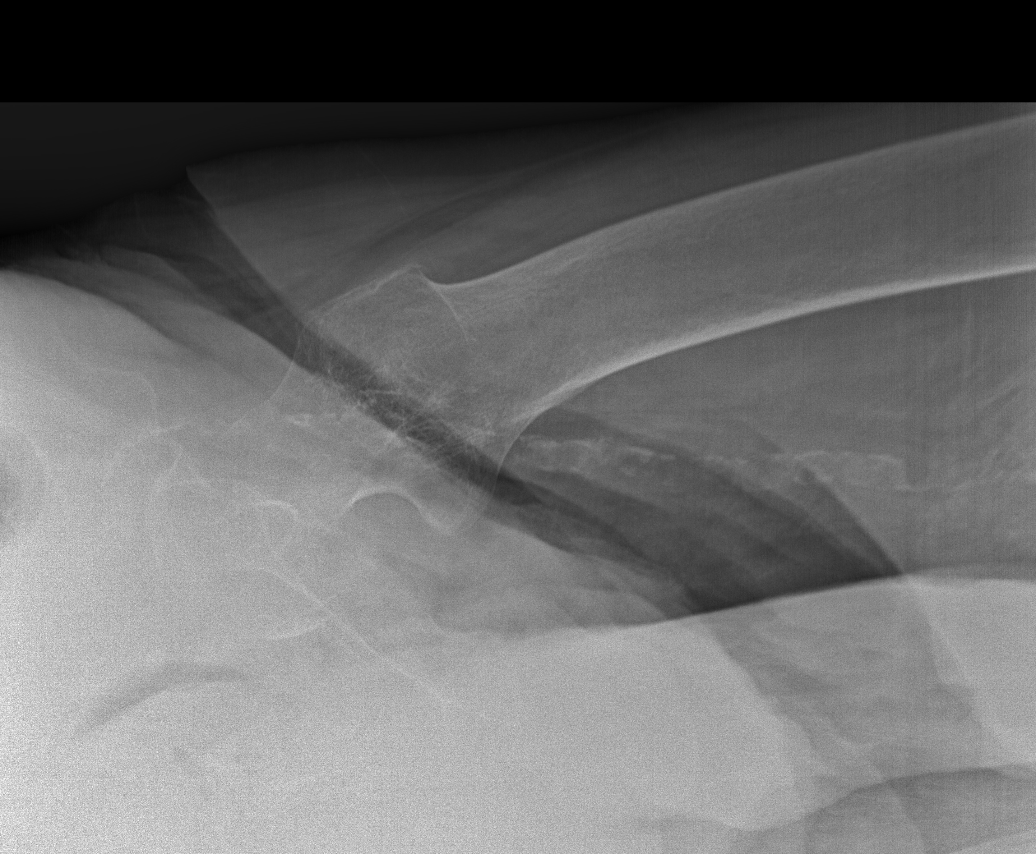

[5 of 5 positions shown; findings below may reference images not displayed]

FINDINGS: RIGHT hip dynamic screw fixation is present. No complicating
features. Encircling cables are present around the LASANTHA and proximal
femoral plate. Prominent stool is present within the rectum.
Radiographs are technically suboptimal due to obliquity and
contracture of the RIGHT hip.

The LEFT hip lateral views are technically suboptimal. No displaced
fracture is identified. On the lateral view, the lateral margin of
the cortex is difficult to visualize but does appear contiguous.

Atherosclerosis is present.
IMPRESSION: No acute osseous abnormality. Contracture of the LEFT hip. Old ORIF
of the RIGHT hip.

## 2015-10-13 IMAGING — CT CT FEMUR *L* W/O CM
2 series · 10 of 14 positions shown, 12 images · non-contrast
Comparison: Plain radiographs 10/04/2014

CLINICAL DATA: Evaluation of fracture seen on previous plain x-ray.
Previous plain films demonstrated suspicion of subcapital left
femoral neck fracture.

EXAM:
CT OF THE LEFT FEMUR WITHOUT CONTRAST
TECHNIQUE: Multidetector CT imaging was performed according to the standard
protocol. Multiplanar CT image reconstructions were also generated.

[Series 5: st lower ext · axial · 0.96mm/px · z∈[-628,-334]mm · 5 of 148 slices shown, 7 images (1 of 2)]
[im 25/148  soft-tissue]
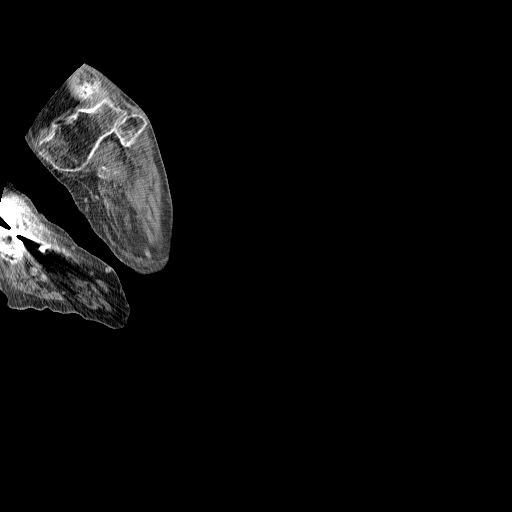
[im 25/148  bone]
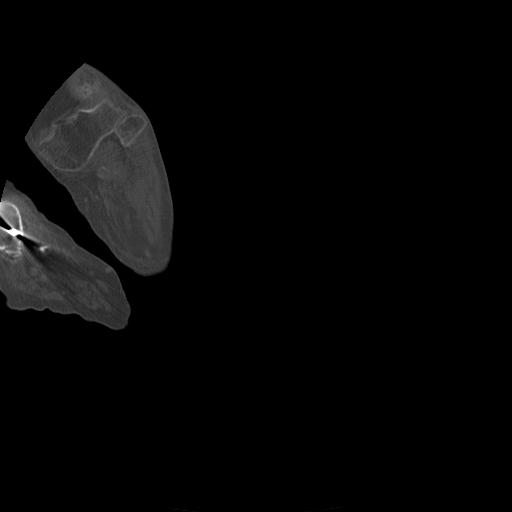
[im 50/148  bone]
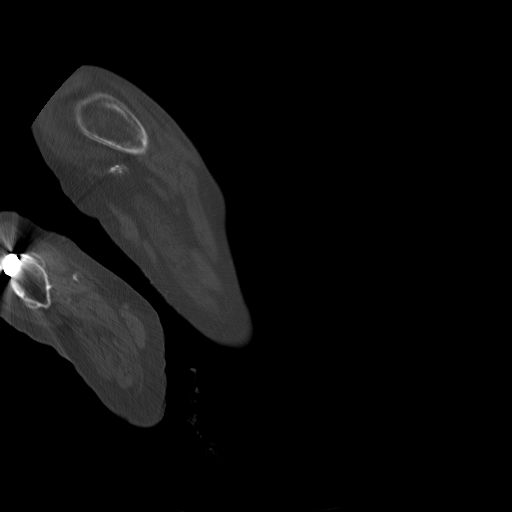
[im 74/148  bone]
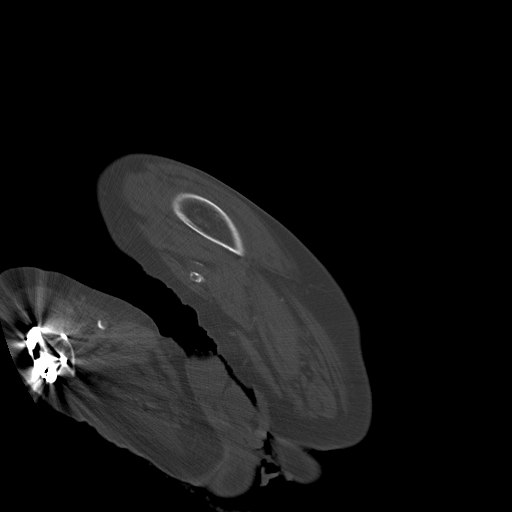
[im 99/148  bone]
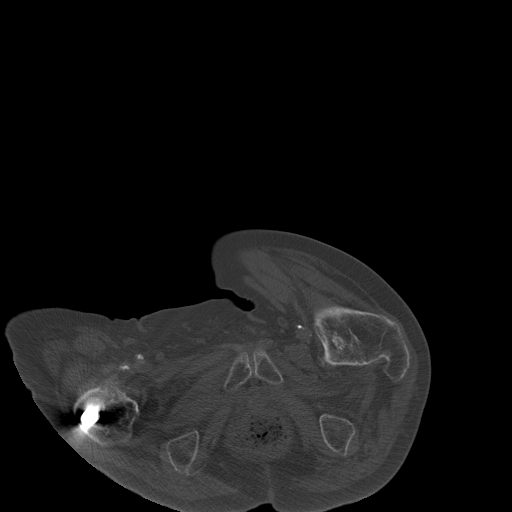
[im 123/148  soft-tissue]
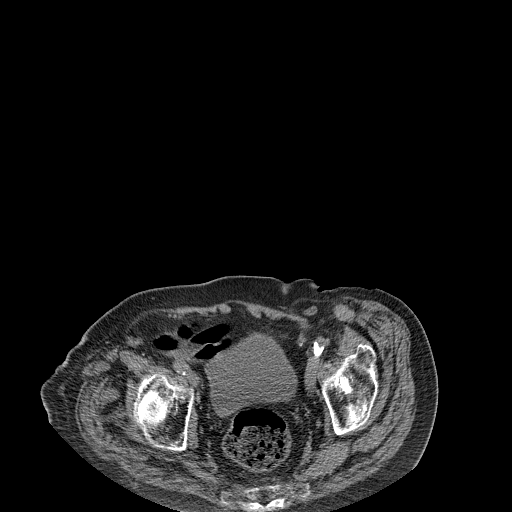
[im 123/148  bone]
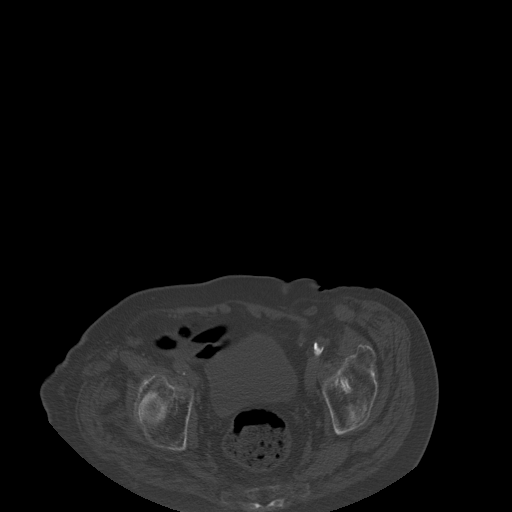

[Series 9: st lower ext · axial · 0.96mm/px · z∈[-628,-334]mm · 5 of 148 slices shown (2 of 2)]
[im 25/148  bone]
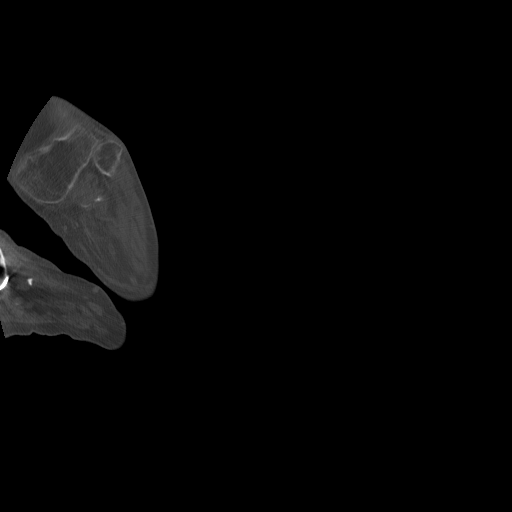
[im 50/148  bone]
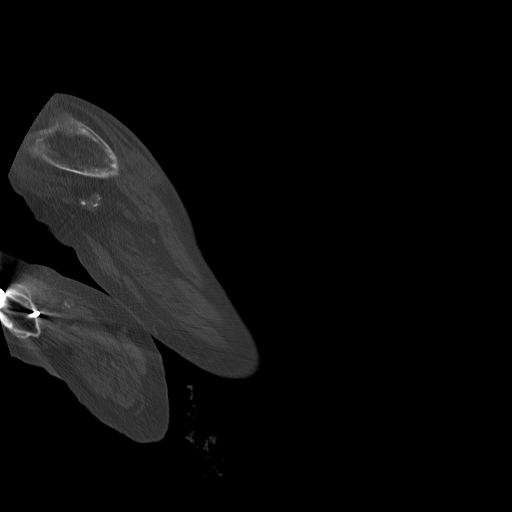
[im 74/148  bone]
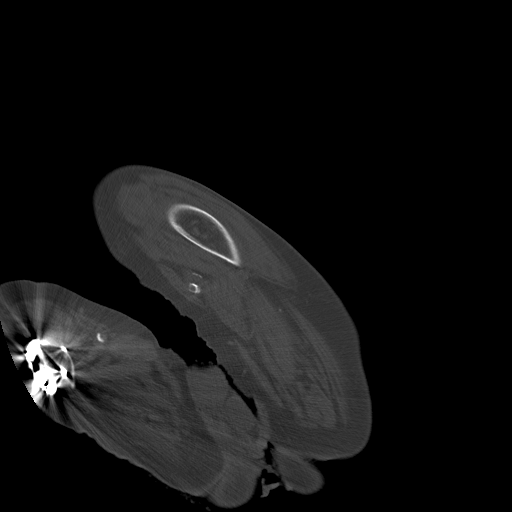
[im 99/148  bone]
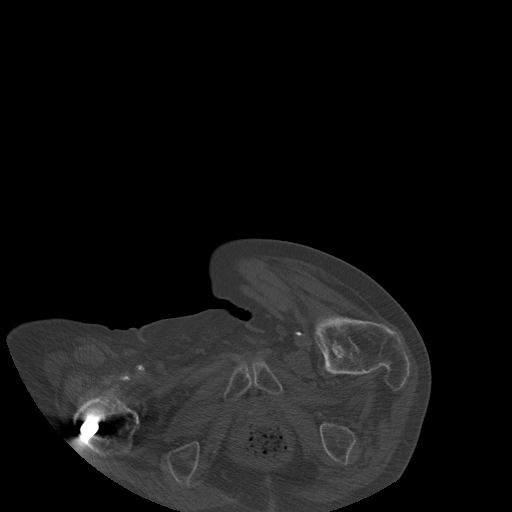
[im 123/148  bone]
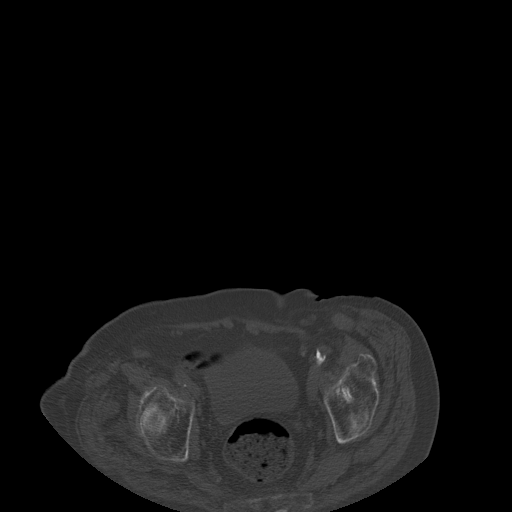

[10 of 14 positions shown; findings below may reference images not displayed]

FINDINGS: Examination is technically limited due to difficulty with patient
positioning, motion and uncooperative patient.

Diffuse bone demineralization. There is sclerosis in the left
femoral neck with cortical irregularity consistent with nondisplaced
fracture. No dislocation of the hip. Linear lucency with cortical
step-off in the distal left femoral metaphysis consistent with
nondisplaced fracture here is well. Visualized pelvis appears
intact. Postoperative changes with fixation hardware in the right
hip.
IMPRESSION: Nondisplaced fractures demonstrated in the left femoral neck and in
the distal left femoral metaphysis.
